# Patient Record
Sex: Male | Born: 1985 | Race: White | Hispanic: No | Smoking: Former smoker
Health system: Southern US, Community
[De-identification: ages and names within clinical notes are randomized; demographics above are authoritative.]

## PROBLEM LIST (undated history)

## (undated) DIAGNOSIS — F32A Depression, unspecified: Secondary | ICD-10-CM

## (undated) DIAGNOSIS — I10 Essential (primary) hypertension: Secondary | ICD-10-CM

## (undated) DIAGNOSIS — G629 Polyneuropathy, unspecified: Secondary | ICD-10-CM

## (undated) DIAGNOSIS — G4733 Obstructive sleep apnea (adult) (pediatric): Secondary | ICD-10-CM

## (undated) DIAGNOSIS — I2510 Atherosclerotic heart disease of native coronary artery without angina pectoris: Secondary | ICD-10-CM

## (undated) DIAGNOSIS — E7800 Pure hypercholesterolemia, unspecified: Secondary | ICD-10-CM

## (undated) DIAGNOSIS — I219 Acute myocardial infarction, unspecified: Secondary | ICD-10-CM

---

## 2016-09-02 HISTORY — PX: CARDIAC CATHETERIZATION: SHX172

## 2021-06-04 ENCOUNTER — Other Ambulatory Visit: Payer: Self-pay

## 2021-06-04 ENCOUNTER — Inpatient Hospital Stay (HOSPITAL_COMMUNITY)
Admission: EM | Admit: 2021-06-04 | Discharge: 2021-06-06 | DRG: 281 | Disposition: A | Discharge status: Home / Self Care | Payer: No Typology Code available for payment source | Attending: Internal Medicine | Admitting: Internal Medicine

## 2021-06-04 ENCOUNTER — Encounter (HOSPITAL_COMMUNITY): Payer: Self-pay | Admitting: Emergency Medicine

## 2021-06-04 ENCOUNTER — Emergency Department (HOSPITAL_COMMUNITY): Payer: No Typology Code available for payment source

## 2021-06-04 DIAGNOSIS — G629 Polyneuropathy, unspecified: Secondary | ICD-10-CM | POA: Diagnosis present

## 2021-06-04 DIAGNOSIS — E7800 Pure hypercholesterolemia, unspecified: Secondary | ICD-10-CM | POA: Diagnosis present

## 2021-06-04 DIAGNOSIS — I2510 Atherosclerotic heart disease of native coronary artery without angina pectoris: Secondary | ICD-10-CM | POA: Diagnosis present

## 2021-06-04 DIAGNOSIS — F419 Anxiety disorder, unspecified: Secondary | ICD-10-CM | POA: Diagnosis present

## 2021-06-04 DIAGNOSIS — Z20822 Contact with and (suspected) exposure to covid-19: Secondary | ICD-10-CM | POA: Diagnosis present

## 2021-06-04 DIAGNOSIS — E876 Hypokalemia: Secondary | ICD-10-CM | POA: Diagnosis not present

## 2021-06-04 DIAGNOSIS — Z7982 Long term (current) use of aspirin: Secondary | ICD-10-CM

## 2021-06-04 DIAGNOSIS — Z6841 Body Mass Index (BMI) 40.0 and over, adult: Secondary | ICD-10-CM | POA: Diagnosis not present

## 2021-06-04 DIAGNOSIS — I10 Essential (primary) hypertension: Secondary | ICD-10-CM | POA: Diagnosis present

## 2021-06-04 DIAGNOSIS — F32A Depression, unspecified: Secondary | ICD-10-CM | POA: Diagnosis present

## 2021-06-04 DIAGNOSIS — I219 Acute myocardial infarction, unspecified: Secondary | ICD-10-CM

## 2021-06-04 DIAGNOSIS — I214 Non-ST elevation (NSTEMI) myocardial infarction: Secondary | ICD-10-CM | POA: Diagnosis present

## 2021-06-04 DIAGNOSIS — G4733 Obstructive sleep apnea (adult) (pediatric): Secondary | ICD-10-CM | POA: Diagnosis present

## 2021-06-04 DIAGNOSIS — Z79899 Other long term (current) drug therapy: Secondary | ICD-10-CM

## 2021-06-04 DIAGNOSIS — Z9989 Dependence on other enabling machines and devices: Secondary | ICD-10-CM

## 2021-06-04 DIAGNOSIS — R079 Chest pain, unspecified: Secondary | ICD-10-CM | POA: Diagnosis present

## 2021-06-04 DIAGNOSIS — R739 Hyperglycemia, unspecified: Secondary | ICD-10-CM | POA: Diagnosis present

## 2021-06-04 DIAGNOSIS — I252 Old myocardial infarction: Secondary | ICD-10-CM | POA: Diagnosis not present

## 2021-06-04 DIAGNOSIS — Z7902 Long term (current) use of antithrombotics/antiplatelets: Secondary | ICD-10-CM | POA: Diagnosis not present

## 2021-06-04 DIAGNOSIS — E785 Hyperlipidemia, unspecified: Secondary | ICD-10-CM | POA: Diagnosis not present

## 2021-06-04 DIAGNOSIS — I249 Acute ischemic heart disease, unspecified: Secondary | ICD-10-CM

## 2021-06-04 HISTORY — DX: Essential (primary) hypertension: I10

## 2021-06-04 HISTORY — DX: Acute myocardial infarction, unspecified: I21.9

## 2021-06-04 HISTORY — DX: Depression, unspecified: F32.A

## 2021-06-04 HISTORY — DX: Obstructive sleep apnea (adult) (pediatric): G47.33

## 2021-06-04 HISTORY — DX: Atherosclerotic heart disease of native coronary artery without angina pectoris: I25.10

## 2021-06-04 HISTORY — DX: Polyneuropathy, unspecified: G62.9

## 2021-06-04 HISTORY — DX: Pure hypercholesterolemia, unspecified: E78.00

## 2021-06-04 LAB — BASIC METABOLIC PANEL
Anion gap: 11 (ref 5–15)
BUN: 9 mg/dL (ref 6–20)
CO2: 27 mmol/L (ref 22–32)
Calcium: 9.1 mg/dL (ref 8.9–10.3)
Chloride: 99 mmol/L (ref 98–111)
Creatinine, Ser: 1.03 mg/dL (ref 0.61–1.24)
GFR, Estimated: >60 mL/min (ref >60)
Glucose, Bld: 107 mg/dL — ABNORMAL HIGH (ref 70–99)
Potassium: 3.3 mmol/L — ABNORMAL LOW (ref 3.5–5.1)
Sodium: 137 mmol/L (ref 135–145)

## 2021-06-04 LAB — CBC
HCT: 42.9 % (ref 39.0–52.0)
Hemoglobin: 14.6 g/dL (ref 13.0–17.0)
MCH: 31.1 pg (ref 26.0–34.0)
MCHC: 34 g/dL (ref 30.0–36.0)
MCV: 91.5 fL (ref 80.0–100.0)
Platelets: 214 10*3/uL (ref 150–400)
RBC: 4.69 MIL/uL (ref 4.22–5.81)
RDW: 12.2 % (ref 11.5–15.5)
WBC: 10.4 10*3/uL (ref 4.0–10.5)
nRBC: 0 % (ref 0.0–0.2)

## 2021-06-04 LAB — HEPATIC FUNCTION PANEL
ALT: 105 U/L — ABNORMAL HIGH (ref 0–44)
AST: 67 U/L — ABNORMAL HIGH (ref 15–41)
Albumin: 3.8 g/dL (ref 3.5–5.0)
Alkaline Phosphatase: 61 U/L (ref 38–126)
Bilirubin, Direct: 0.1 mg/dL (ref 0.0–0.2)
Indirect Bilirubin: 0.6 mg/dL
Total Bilirubin: 0.7 mg/dL (ref 0.3–1.2)
Total Protein: 6.6 g/dL (ref 6.5–8.1)

## 2021-06-04 LAB — MAGNESIUM: Magnesium: 1.9 mg/dL (ref 1.7–2.4)

## 2021-06-04 LAB — RESP PANEL BY RT-PCR (FLU A&B, COVID) ARPGX2
Influenza A by PCR: NEGATIVE
Influenza B by PCR: NEGATIVE
SARS Coronavirus 2 by RT PCR: NEGATIVE

## 2021-06-04 LAB — TROPONIN I (HIGH SENSITIVITY)
Troponin I (High Sensitivity): 7445 ng/L (ref <18)
Troponin I (High Sensitivity): 6007 ng/L (ref <18)
Troponin I (High Sensitivity): 3768 ng/L (ref <18)

## 2021-06-04 LAB — TSH: TSH: 3.408 u[IU]/mL (ref 0.350–4.500)

## 2021-06-04 LAB — LIPASE, BLOOD: Lipase: 32 U/L (ref 11–51)

## 2021-06-04 LAB — HIV ANTIBODY (ROUTINE TESTING W REFLEX): HIV Screen 4th Generation wRfx: NONREACTIVE

## 2021-06-04 MED ORDER — FENTANYL CITRATE PF 50 MCG/ML IJ SOSY
50.0000 ug | PREFILLED_SYRINGE | Freq: Once | INTRAMUSCULAR | Status: AC
  Administered 2021-06-04: 50 ug via INTRAVENOUS
  Filled 2021-06-04: qty 1

## 2021-06-04 MED ORDER — SODIUM CHLORIDE 0.9 % IV SOLN: 250.0000 mL | INTRAVENOUS | Status: DC | PRN

## 2021-06-04 MED ORDER — ASPIRIN EC 81 MG PO TBEC
81.0000 mg | DELAYED_RELEASE_TABLET | Freq: Every day | ORAL | Status: DC
  Administered 2021-06-06: 81 mg via ORAL
  Filled 2021-06-04: qty 1

## 2021-06-04 MED ORDER — ZOLPIDEM TARTRATE 5 MG PO TABS: 5.0000 mg | ORAL_TABLET | Freq: Every evening | ORAL | Status: DC | PRN

## 2021-06-04 MED ORDER — BUPROPION HCL ER (XL) 150 MG PO TB24
300.0000 mg | ORAL_TABLET | Freq: Every day | ORAL | Status: DC
  Administered 2021-06-06: 300 mg via ORAL
  Filled 2021-06-04: qty 2
  Filled 2021-06-04: qty 1

## 2021-06-04 MED ORDER — ACETAMINOPHEN 325 MG PO TABS: 650.0000 mg | ORAL_TABLET | ORAL | Status: DC | PRN

## 2021-06-04 MED ORDER — CLOPIDOGREL BISULFATE 75 MG PO TABS
75.0000 mg | ORAL_TABLET | Freq: Every day | ORAL | Status: DC
  Administered 2021-06-05 – 2021-06-06 (×2): 75 mg via ORAL
  Filled 2021-06-04 (×2): qty 1

## 2021-06-04 MED ORDER — ONDANSETRON HCL 4 MG/2ML IJ SOLN: 4.0000 mg | Freq: Four times a day (QID) | INTRAMUSCULAR | Status: DC | PRN

## 2021-06-04 MED ORDER — ASPIRIN 300 MG RE SUPP: 300.0000 mg | RECTAL | Status: DC

## 2021-06-04 MED ORDER — METOPROLOL TARTRATE 25 MG PO TABS: 75.0000 mg | ORAL_TABLET | Freq: Two times a day (BID) | ORAL | Status: DC

## 2021-06-04 MED ORDER — TRAZODONE HCL 100 MG PO TABS
200.0000 mg | ORAL_TABLET | Freq: Every day | ORAL | Status: DC
  Administered 2021-06-05: 100 mg via ORAL
  Administered 2021-06-05: 200 mg via ORAL
  Filled 2021-06-04: qty 4
  Filled 2021-06-04: qty 2
  Filled 2021-06-04: qty 4

## 2021-06-04 MED ORDER — SODIUM CHLORIDE 0.9% FLUSH
3.0000 mL | Freq: Two times a day (BID) | INTRAVENOUS | Status: DC
  Administered 2021-06-06: 3 mL via INTRAVENOUS

## 2021-06-04 MED ORDER — NITROGLYCERIN IN D5W 200-5 MCG/ML-% IV SOLN
0.0000 ug/min | INTRAVENOUS | Status: DC
  Administered 2021-06-04: 2.5 ug/min via INTRAVENOUS
  Filled 2021-06-04: qty 250

## 2021-06-04 MED ORDER — HEPARIN BOLUS VIA INFUSION
4000.0000 [IU] | Freq: Once | INTRAVENOUS | Status: AC
  Administered 2021-06-04: 4000 [IU] via INTRAVENOUS
  Filled 2021-06-04: qty 4000

## 2021-06-04 MED ORDER — NITROGLYCERIN 0.4 MG SL SUBL: 0.4000 mg | SUBLINGUAL_TABLET | SUBLINGUAL | Status: DC | PRN

## 2021-06-04 MED ORDER — ALPRAZOLAM 0.25 MG PO TABS: 0.2500 mg | ORAL_TABLET | Freq: Two times a day (BID) | ORAL | Status: DC | PRN

## 2021-06-04 MED ORDER — HEPARIN (PORCINE) 25000 UT/250ML-% IV SOLN
1550.0000 [IU]/h | INTRAVENOUS | Status: DC
  Administered 2021-06-04: 19:00:00 1250 [IU]/h via INTRAVENOUS
  Administered 2021-06-05: 1550 [IU]/h via INTRAVENOUS
  Filled 2021-06-04 (×2): qty 250

## 2021-06-04 MED ORDER — GABAPENTIN 300 MG PO CAPS
600.0000 mg | ORAL_CAPSULE | Freq: Two times a day (BID) | ORAL | Status: DC
  Administered 2021-06-04 – 2021-06-06 (×3): 600 mg via ORAL
  Filled 2021-06-04 (×3): qty 2

## 2021-06-04 MED ORDER — ATORVASTATIN CALCIUM 80 MG PO TABS
80.0000 mg | ORAL_TABLET | Freq: Every day | ORAL | Status: DC
  Administered 2021-06-06: 80 mg via ORAL
  Filled 2021-06-04: qty 1

## 2021-06-04 MED ORDER — ASPIRIN 81 MG PO CHEW: 324.0000 mg | CHEWABLE_TABLET | ORAL | Status: DC

## 2021-06-04 MED ORDER — METOPROLOL TARTRATE 25 MG PO TABS
25.0000 mg | ORAL_TABLET | Freq: Two times a day (BID) | ORAL | Status: DC
  Administered 2021-06-04 – 2021-06-06 (×3): 25 mg via ORAL
  Filled 2021-06-04 (×2): qty 1

## 2021-06-04 MED ORDER — DULOXETINE HCL 60 MG PO CPEP
60.0000 mg | ORAL_CAPSULE | Freq: Two times a day (BID) | ORAL | Status: DC
Start: 2021-06-04 — End: 2021-06-06
  Administered 2021-06-05 – 2021-06-06 (×3): 60 mg via ORAL
  Filled 2021-06-04 (×5): qty 1

## 2021-06-04 MED ORDER — SODIUM CHLORIDE 0.9% FLUSH: 3.0000 mL | INTRAVENOUS | Status: DC | PRN

## 2021-06-04 NOTE — H&P (Signed)
ADMISSION HISTORY & PHYSICAL  Patient Name: Joshua Flynn Date of Encounter: 06/04/2021 Primary Care Physician: Pcp, No Cardiologist: None  Chief Complaint   NSTEMI  Patient Profile   36 yo male with known CAD (non-obstructive) by cath in 2018, presents with severe chest pain and elevated troponin, consistent with NSTEMI  HPI   This is a 36 y.o. male Thor with a past medical history significant for CAD, HTN, dyslipidemia, tobacco and etoh use, presents with chest pain that started last evening.  The pain was described as dull but sometimes sharp and radiates to the left chest.  The pain is similar to what symptoms he had had in 2018, at which time he was found to have NSTEMI.  He underwent cardiac catheterization at [facility] in [place], [place] after being transferred from the [facility] ER.  According to the records at that time he had persistent chest pain and was admitted initially for pericarditis however echo and CRP were normal.  Troponin he peaked at 0.25.  CT was negative for dissection or PE.  He underwent left heart catheterization.  This demonstrated moderate 40 to 50% mid LAD stenosis and distal tapering of the LAD.  There was moderately to severe ostial diagonal branch stenosis, moderate 40 to 50% distal left circumflex stenosis of a codominant vessel and diffuse tubular 60% mid RCA stenosis.  iFR was performed of the mid RCA which was negative.  Aggressive medical management was recommended.  He reports compliance with his medications and was discharged at that time on aspirin, 80 mg atorvastatin, Plavix, Imdur, metoprolol and hydrochlorothiazide.   PMHx   Past Medical History:  Diagnosis Date   Depression    Hypercholesterolemia    Hypertension    Myocardial infarct [facility])     Past Surgical History:  Procedure Laterality Date   CARDIAC CATHETERIZATION  09/2016   [facility], [place]    FAMHx   Family History  Adopted: Yes    SOCHx    reports that he quit  smoking about 6 years ago. His smoking use included cigarettes. He has never used smokeless tobacco. He reports current alcohol use. He reports that he does not currently use drugs.  Outpatient Medications   1) ASPIRIN 81MG  EC TAB TAKE ONE TABLET BY MOUTH DAILY ACTIVE FOR HEART 2) ATORVASTATIN CALCIUM 80MG  TAB TAKE ONE TABLET BY ACTIVE MOUTH AT BEDTIME FOR CHOLESTEROL 3) BUPROPION HCL 300MG  24HR SA TAB TAKE ONE TABLET BY ACTIVE MOUTH EVERY MORNING FOR DEPRESSION 4) CLOPIDOGREL BISULFATE 75MG  TAB TAKE ONE TABLET BY ACTIVE MOUTH DAILY 5) DULOXETINE HCL 60MG  EC CAP TAKE ONE CAPSULE BY MOUTH ACTIVE TWICE A DAY 6) GABAPENTIN 300MG  CAP TAKE TWO CAPSULES BY MOUTH TWICE ACTIVE A DAY FOR ANXIETY, SLEEP, AND ALCOHOL CRAVINGS 7) HYDROCHLOROTHIAZIDE 50MG  TAB TAKE ONE TABLET BY MOUTH ACTIVE DAILY 8) ISOSORBIDE MONONITRATE 120MG  SA TAB TAKE ONE TABLET ACTIVE BY MOUTH DAILY 9) METOPROLOL TARTRATE 50MG  TAB TAKE ONE AND ONE-HALF ACTIVE TABLETS BY MOUTH TWICE A DAY FOR HEART 10) TRAZODONE HCL 100MG  TAB TAKE TWO TABLETS BY MOUTH AT ACTIVE BEDTIME FOR DEPRESSION/SLEEP   Inpatient Medications    Scheduled Meds:  aspirin EC  81 mg Oral Daily   atorvastatin  80 mg Oral Daily   [START ON 06/05/2021] buPROPion  300 mg Oral Daily   clopidogrel  75 mg Oral Daily   DULoxetine  60 mg Oral BID   gabapentin  600 mg Oral BID   metoprolol tartrate  25 mg Oral  BID   traZODone  200 mg Oral QHS     Continuous Infusions:  heparin 1,250 Units/hr (06/04/21 1903)   nitroGLYCERIN      PRN Meds:    ALLERGIES   No Known Allergies  ROS   Pertinent items noted in HPI and remainder of comprehensive ROS otherwise negative.  Vitals   Vitals:   06/04/21 1955 06/04/21 2015 06/04/21 2030 06/04/21 2045  BP: 105/64 99/64 101/64 104/68  Pulse: 72 72 70 70  Resp: 18 12 20  (!) 21  Temp:      TempSrc:      SpO2: 97% 97% 96% 95%  Weight:      Height:       No intake or output data in the 24 hours ending  06/04/21 2121 Filed Weights   06/04/21 1735  Weight: (!) 137 kg    Physical Exam   General appearance: alert and no distress Neck: no carotid bruit, no JVD, thyroid not enlarged, symmetric, no tenderness/mass/nodules, and face appears flushed Lungs: clear to auscultation bilaterally Heart: regular rate and rhythm, S1, S2 normal, no murmur, click, rub or gallop Abdomen: soft, non-tender; bowel sounds normal; no masses,  no organomegaly and obese Extremities: extremities normal, atraumatic, no cyanosis or edema Pulses: 2+ and symmetric Skin: Skin color, texture, turgor normal. No rashes or lesions Neurologic: Grossly normal Psych: pleasant  Labs   Results for orders placed or performed during the hospital encounter of 06/04/21 (from the past 48 hour(s))  Basic metabolic panel     Status: Abnormal   Collection Time: 06/04/21  5:30 PM  Result Value Ref Range   Sodium 137 135 - 145 mmol/L   Potassium 3.3 (L) 3.5 - 5.1 mmol/L   Chloride 99 98 - 111 mmol/L   CO2 27 22 - 32 mmol/L   Glucose, Bld 107 (H) 70 - 99 mg/dL    Comment: Glucose reference range applies only to samples taken after fasting for at least 8 hours.   BUN 9 6 - 20 mg/dL   Creatinine, Ser 3.47 0.61 - 1.24 mg/dL   Calcium 9.1 8.9 - 42.5 mg/dL   GFR, Estimated >95 >63 mL/min    Comment: (NOTE) Calculated using the CKD-EPI Creatinine Equation (2021)    Anion gap 11 5 - 15    Comment: Performed at [facility], [address] [place], [place] [address]  CBC     Status: None   Collection Time: 06/04/21  5:30 PM  Result Value Ref Range   WBC 10.4 4.0 - 10.5 K/uL   RBC 4.69 4.22 - 5.81 MIL/uL   Hemoglobin 14.6 13.0 - 17.0 g/dL   HCT 33.2 95.1 - 88.4 %   MCV 91.5 80.0 - 100.0 fL   MCH 31.1 26.0 - 34.0 pg   MCHC 34.0 30.0 - 36.0 g/dL   RDW 16.6 06.3 - 01.6 %   Platelets 214 150 - 400 K/uL   nRBC 0.0 0.0 - 0.2 %    Comment: Performed at [facility], [address], [place], [place] [address]   Troponin I (High Sensitivity)     Status: Abnormal   Collection Time: 06/04/21  5:30 PM  Result Value Ref Range   Troponin I (High Sensitivity) 7,445 (HH) <18 ng/L    Comment: CRITICAL RESULT CALLED TO, READ BACK BY AND VERIFIED WITH: Carma Leaven, RN ON1/31/23 @ 1842 BY APPIAH D (NOTE) Elevated high sensitivity troponin I (hsTnI) values and significant  changes across serial  measurements may suggest ACS but many other  chronic and acute conditions are known to elevate hsTnI results.  Refer to the Links section for chest pain algorithms and additional  guidance. Performed at [facility], [place] [address], [place], [place] [address]   Resp Panel by RT-PCR (Flu A&B, Covid) Nasopharyngeal Swab     Status: None   Collection Time: 06/04/21  5:52 PM   Specimen: Nasopharyngeal Swab; Nasopharyngeal(NP) swabs in vial transport medium  Result Value Ref Range   SARS Coronavirus 2 by RT PCR NEGATIVE NEGATIVE    Comment: (NOTE) SARS-CoV-2 target nucleic acids are NOT DETECTED.  The SARS-CoV-2 RNA is generally detectable in upper respiratory specimens during the acute phase of infection. The lowest concentration of SARS-CoV-2 viral copies this assay can detect is 138 copies/mL. A negative result does not preclude SARS-Cov-2 infection and should not be used as the sole basis for treatment or other patient management decisions. A negative result may occur with  improper specimen collection/handling, submission of specimen other than nasopharyngeal swab, presence of viral mutation(s) within the areas targeted by this assay, and inadequate number of viral copies(<138 copies/mL). A negative result must be combined with clinical observations, patient history, and epidemiological information. The expected result is Negative.  Fact Sheet for Patients:  EntrepreneurPulse.com.au  Fact Sheet for Healthcare Providers:  IncredibleEmployment.be  This test is no t  yet approved or cleared by the [place] FDA and  has been authorized for detection and/or diagnosis of SARS-CoV-2 by FDA under an Emergency Use Authorization (EUA). This EUA will remain  in effect (meaning this test can be used) for the duration of the COVID-19 declaration under Section 564(b)(1) of the Act, 21 U.S.C.section 360bbb-3(b)(1), unless the authorization is terminated  or revoked sooner.       Influenza A by PCR NEGATIVE NEGATIVE   Influenza B by PCR NEGATIVE NEGATIVE    Comment: (NOTE) The Xpert Xpress SARS-CoV-2/FLU/RSV plus assay is intended as an aid in the diagnosis of influenza from Nasopharyngeal swab specimens and should not be used as a sole basis for treatment. Nasal washings and aspirates are unacceptable for Xpert Xpress SARS-CoV-2/FLU/RSV testing.  Fact Sheet for Patients: EntrepreneurPulse.com.au  Fact Sheet for Healthcare Providers: IncredibleEmployment.be  This test is not yet approved or cleared by the [place] FDA and has been authorized for detection and/or diagnosis of SARS-CoV-2 by FDA under an Emergency Use Authorization (EUA). This EUA will remain in effect (meaning this test can be used) for the duration of the COVID-19 declaration under Section 564(b)(1) of the Act, 21 U.S.C. section 360bbb-3(b)(1), unless the authorization is terminated or revoked.  Performed at [facility], [place] [address] [place], [place] [address]   Hepatic function panel     Status: Abnormal   Collection Time: 06/04/21  8:00 PM  Result Value Ref Range   Total Protein 6.6 6.5 - 8.1 g/dL   Albumin 3.8 3.5 - 5.0 g/dL   AST 67 (H) 15 - 41 U/L   ALT 105 (H) 0 - 44 U/L   Alkaline Phosphatase 61 38 - 126 U/L   Total Bilirubin 0.7 0.3 - 1.2 mg/dL   Bilirubin, Direct 0.1 0.0 - 0.2 mg/dL   Indirect Bilirubin 0.6 mg/dL    Comment: Performed at [place] [address], [place], [place] [address]  Lipase, blood      Status: None   Collection Time: 06/04/21  8:00 PM  Result Value Ref Range  Lipase 32 11 - 51 U/L    Comment: Performed at [facility], [place] [address] [place], [place] [address]    ECG   Sinus rhythm at 85, small inferior Q waves- Personally Reviewed  Telemetry   Sinus rhythm - Personally Reviewed  Radiology   DG Chest Portable 1 View  Result Date: 06/04/2021 CLINICAL DATA:  Chest pain EXAM: PORTABLE CHEST 1 VIEW COMPARISON:  06/04/2021 FINDINGS: The heart size and mediastinal contours are within normal limits. Both lungs are clear. The visualized skeletal structures are unremarkable. IMPRESSION: No active disease. Electronically Signed   By: Franchot Gallo M.D.   On: 06/04/2021 18:18    Cardiac Studies   N/A  Assessment   Principal Problem:   NSTEMI (non-ST elevated myocardial infarction) ([place]) Active Problems:   HTN (hypertension)   Dyslipidemia, goal LDL below 55   Neuropathy   Depression   CAD in native artery   OSA on CPAP   Morbid obesity ([place])   Plan   NSTEMI Presented with significantly elevated troponin greater than 7000.  Subsequent troponin is pending.  Suspect he had possible out-of-hospital STEMI although has no evidence of ST elevation but small inferior Q waves.  He had known multivessel mild to moderate coronary disease which is nonobstructive during a prior non-STEMI in 2018.  On IV heparin.  Chest pain persists and will start nitroglycerin and provide opiates for additional pain control.  Plan for elective cardiac catheterization tomorrow unless he has uncontrollable pain overnight and may need further intervention.  The overnight fellow was made aware of his situation.  We will continue aspirin, Plavix, statin, and beta-blocker as well. HTN Blood pressure has been normal to low normal.  We will need to monitor on nitroglycerin infusion.  Continue current medicines except for hydrochlorothiazide which will be held due to probable dye load  expected a cardiac catheterization and to allow more room for antianginal medicines. HLD He is currently on atorvastatin 80 mg nightly which we will continue.  Check fasting lipid profile in the morning.  Target LDL less than 55 given very high risk and acute NSTEMI with multiple risk factors. Depression Continue Cymbalta and bupropion. Neuropathy Continue gabapentin 600 mg twice daily. OSA on CPAP CPAP nightly as per RT. 7.   Transaminitis AST and ALT are elevated at 67/105.  This may be due to high potency atorvastatin with underlying fatty liver disease.  We will need to monitor this.  FULL CODE  Time Spent Directly with Patient:  I have spent a total of 45 minutes with patient reviewing hospital notes, telemetry, EKGs, labs and examining the patient as well as establishing an assessment and plan that was discussed with the patient.  > 50% of time was spent in direct patient care.   Length of Stay:  LOS: 0 days   Pixie Casino, MD, [facility], [place] Director of the Advanced Lipid Disorders &  Cardiovascular Risk Reduction Clinic Diplomate of the American Board of Clinical Lipidology Attending Cardiologist  Direct Dial: [phone]   Fax: [phone]  Website:  [email or web address] 06/04/2021, 9:21 PM

## 2021-06-04 NOTE — ED Notes (Signed)
Lab called w/ critical trop Molli Hazard MD aware

## 2021-06-04 NOTE — ED Notes (Signed)
Cardiology paged regarding nitro drip and pt's lower BP. Instructor to continue to start nitro drip for pain control

## 2021-06-04 NOTE — ED Provider Notes (Signed)
[facility] EMERGENCY DEPARTMENT Provider Note   CSN: [id number] Arrival date & time: 06/04/21  1723     History  Chief Complaint  Patient presents with   Chest Pain    Joshua Flynn is a 36 y.o. male.  The history is provided by the patient and medical records.  Chest Pain Joshua Flynn is a 36 y.o. male who presents to the Emergency Department complaining of chest pain.  He presents to the ED for evaluation of chest pain that started last night.  Pain is described as dull and sharp that radiates to the left chest.  Sxs are similar to last MI 4 years ago.  No fever, cough, N, sob.  He is compliant with his home medications.  He did take 3 Imdur, 120 mg for his pain today due to not having nitroglycerin available.  He did take 1 nitroglycerin by EMS, which only worsened his pain.  His metoprolol was recently increased to 100 mg twice daily.  He is on Plavix 75 mg daily.  He is a patient of the [place].  He had a heart cath in 2018 at [facility].  No stents were placed at that time but he was placed on aggressive medical therapy.      Home Medications Prior to Admission medications   Not on File      Allergies    Patient has no known allergies.    Review of Systems   Review of Systems  Cardiovascular:  Positive for chest pain.  All other systems reviewed and are negative.  Physical Exam Updated Vital Signs BP 104/68    Pulse 70    Temp 98.1 F (36.7 C) (Oral)    Resp (!) 21    Ht 5\' 10"  (1.778 m)    Wt (!) 137 kg    SpO2 95%    BMI 43.33 kg/m  Physical Exam Vitals and nursing note reviewed.  Constitutional:      General: He is in acute distress.     Appearance: He is well-developed. He is ill-appearing and diaphoretic.  HENT:     Head: Normocephalic and atraumatic.  Cardiovascular:     Rate and Rhythm: Normal rate and regular rhythm.     Heart sounds: No murmur heard. Pulmonary:     Effort: Pulmonary effort is normal. No respiratory distress.      Breath sounds: Normal breath sounds.  Abdominal:     Palpations: Abdomen is soft.     Tenderness: There is no abdominal tenderness. There is no guarding or rebound.  Musculoskeletal:        General: No swelling or tenderness.     Comments: 2+ DP pulses bilaterally  Skin:    General: Skin is warm.  Neurological:     Mental Status: He is alert and oriented to person, place, and time.  Psychiatric:        Behavior: Behavior normal.    ED Results / Procedures / Treatments   Labs (all labs ordered are listed, but only abnormal results are displayed) Labs Reviewed  BASIC METABOLIC PANEL - Abnormal; Notable for the following components:      Result Value   Potassium 3.3 (*)    Glucose, Bld 107 (*)    All other components within normal limits  HEPATIC FUNCTION PANEL - Abnormal; Notable for the following components:   AST 67 (*)    ALT 105 (*)    All other components within normal limits  TROPONIN I (HIGH SENSITIVITY) - Abnormal; Notable for the following components:   Troponin I (High Sensitivity) 7,445 (*)    All other components within normal limits  RESP PANEL BY RT-PCR (FLU A&B, COVID) [id number]  CBC  LIPASE, BLOOD  HEPARIN LEVEL (UNFRACTIONATED)  CBC  HEPARIN LEVEL (UNFRACTIONATED)  TROPONIN I (HIGH SENSITIVITY)    EKG EKG Interpretation  Date/Time:  Tuesday June 04 2021 17:51:53 EST Ventricular Rate:  85 PR Interval:  148 QRS Duration: 93 QT Interval:  369 QTC Calculation: 439 R Axis:   44 Text Interpretation: Sinus rhythm Confirmed by Tilden Fossa [phone]) on 06/04/2021 6:03:40 PM  Radiology DG Chest Portable 1 View  Result Date: 06/04/2021 CLINICAL DATA:  Chest pain EXAM: PORTABLE CHEST 1 VIEW COMPARISON:  06/04/2021 FINDINGS: The heart size and mediastinal contours are within normal limits. Both lungs are clear. The visualized skeletal structures are unremarkable. IMPRESSION: No active disease. Electronically Signed   By: Marlan Palau M.D.   On:  06/04/2021 18:18    Procedures Procedures   CRITICAL CARE Performed by: Tilden Fossa   Total critical care time: 40 minutes  Critical care time was exclusive of separately billable procedures and treating other patients.  Critical care was necessary to treat or prevent imminent or life-threatening deterioration.  Critical care was time spent personally by me on the following activities: development of treatment plan with patient and/or surrogate as well as nursing, discussions with consultants, evaluation of patient's response to treatment, examination of patient, obtaining history from patient or surrogate, ordering and performing treatments and interventions, ordering and review of laboratory studies, ordering and review of radiographic studies, pulse oximetry and re-evaluation of patient's condition.  Medications Ordered in ED Medications  heparin ADULT infusion 100 units/mL (25000 units/259mL) (1,250 Units/hr Intravenous New Bag/Given 06/04/21 1903)  aspirin EC tablet 81 mg (81 mg Oral Not Given 06/04/21 2003)  atorvastatin (LIPITOR) tablet 80 mg (has no administration in time range)  buPROPion (WELLBUTRIN XL) 24 hr tablet 300 mg (has no administration in time range)  clopidogrel (PLAVIX) tablet 75 mg (has no administration in time range)  DULoxetine (CYMBALTA) DR capsule 60 mg (has no administration in time range)  gabapentin (NEURONTIN) capsule 600 mg (has no administration in time range)  traZODone (DESYREL) tablet 200 mg (has no administration in time range)  nitroGLYCERIN 50 mg in dextrose 5 % 250 mL (0.2 mg/mL) infusion (has no administration in time range)  metoprolol tartrate (LOPRESSOR) tablet 25 mg (has no administration in time range)  fentaNYL (SUBLIMAZE) injection 50 mcg (50 mcg Intravenous Given 06/04/21 1757)  fentaNYL (SUBLIMAZE) injection 50 mcg (50 mcg Intravenous Given 06/04/21 1901)  heparin bolus via infusion 4,000 Units (4,000 Units Intravenous Bolus from Bag  06/04/21 1903)    ED Course/ Medical Decision Making/ A&P                           Medical Decision Making Amount and/or Complexity of Data Reviewed Labs: ordered. Radiology: ordered.  Risk Prescription drug management. Decision regarding hospitalization.  Patient with history of coronary artery disease, hypertension, hyperlipidemia here for evaluation of chest pain that feels similar to his prior MI.  He is ill-appearing on ED presentation with diaphoresis, distress.  He has symmetric pulses.  EKG does show Q waves in the inferior leads but no ST elevation.  No priors are available for for comparison.  He received 324 mg of aspirin prior to ED arrival.  Chest x-ray  without acute abnormality, personally reviewed.  Current clinical picture is not consistent with PE, dissection.  Concern for acute coronary syndrome given his symptoms, EKG.  Due to patient having hypotension prehospital with nitroglycerin as well as no relief of pain he was treated with fentanyl.  His initial troponin did return elevated at greater than 7000.  Records reviewed in epic, he did have a prior cardiac cath in 2018 that showed extensive disease at that time.  Discussed with patient findings of studies and recommendation for admission and he is in agreement with treatment plan.  Cardiology consulted for admission for ongoing treatment.  He was started on heparin drip for acute coronary syndrome.         Final Clinical Impression(s) / ED Diagnoses Final diagnoses:  ACS (acute coronary syndrome) [facility])    Rx / DC Orders ED Discharge Orders     None         Tilden Fossa, MD 06/04/21 2128

## 2021-06-04 NOTE — Progress Notes (Signed)
ANTICOAGULATION CONSULT NOTE - Follow Up Consult  Pharmacy Consult for Heparin drip Indication: chest pain/ACS  No Known Allergies  Patient Measurements: Height: 5\' 10"  (177.8 cm) Weight: (!) 137 kg (302 lb) IBW/kg (Calculated) : 73 Heparin Dosing Weight: 105 kg  Vital Signs: Temp: 98.1 F (36.7 C) (01/31 1730) Temp Source: Oral (01/31 1730) BP: 105/51 (01/31 1845) Pulse Rate: 84 (01/31 1845)  Labs: Recent Labs    06/04/21 1730  HGB 14.6  HCT 42.9  PLT 214  CREATININE 1.03  TROPONINIHS 7,445*    Estimated Creatinine Clearance: 136.9 mL/min (by C-G formula based on SCr of 1.03 mg/dL).   Medications:  Infusions:   Assessment: 36 y.o. male who presents to the Emergency Department complaining of chest pain. Heparin consult for IV heparin. No documented anticoagulation PTA  H/H 14.6/429, plt 214  Goal of Therapy:  Heparin level 0.3-0.7 units/ml Monitor platelets by anticoagulation protocol: Yes   Plan:  Heparin 4000 units bolus x1 followed by heparin drip at 1250 units/hr Heparin level at 6 hours Daily heparin level and CBC ordered Monitor for signs/symptoms of bleed  Thank you for allowing pharmacy to be a part of this patients care.  02-22-1983, PharmD Clinical Pharmacist  Please check AMION for all [facility] Pharmacy numbers After 10:00 PM, call Main Pharmacy [phone]

## 2021-06-04 NOTE — ED Triage Notes (Signed)
Pt here from home via [facility] for chest pain, started last night approx 1800 after dinner, pt says it has been ongoing since. Pt describes it L non-radiating cp, fluctuates between dull & sharp pain. 324mg  ASA, ems only gave 1 SL nitro due to pain increasing after nitro and BP went from 124/70 to 98/60. 20g L AC, NS given. Hx MI no stents

## 2021-06-05 ENCOUNTER — Encounter (HOSPITAL_COMMUNITY): Payer: Self-pay | Admitting: Internal Medicine

## 2021-06-05 ENCOUNTER — Inpatient Hospital Stay (HOSPITAL_COMMUNITY): Payer: No Typology Code available for payment source

## 2021-06-05 ENCOUNTER — Encounter (HOSPITAL_COMMUNITY)
Admission: EM | Disposition: A | Discharge status: Home / Self Care | Payer: Self-pay | Source: Home / Self Care | Attending: Internal Medicine

## 2021-06-05 DIAGNOSIS — I214 Non-ST elevation (NSTEMI) myocardial infarction: Secondary | ICD-10-CM

## 2021-06-05 HISTORY — PX: LEFT HEART CATH AND CORONARY ANGIOGRAPHY: CATH118249

## 2021-06-05 LAB — CBC
HCT: 39 % (ref 39.0–52.0)
Hemoglobin: 13.3 g/dL (ref 13.0–17.0)
MCH: 31.1 pg (ref 26.0–34.0)
MCHC: 34.1 g/dL (ref 30.0–36.0)
MCV: 91.3 fL (ref 80.0–100.0)
Platelets: 162 10*3/uL (ref 150–400)
RBC: 4.27 MIL/uL (ref 4.22–5.81)
RDW: 12.3 % (ref 11.5–15.5)
WBC: 6.9 10*3/uL (ref 4.0–10.5)
nRBC: 0 % (ref 0.0–0.2)

## 2021-06-05 LAB — ECHOCARDIOGRAM COMPLETE
AR max vel: 3.77 cm2
AV Area VTI: 3.6 cm2
AV Area mean vel: 3.56 cm2
AV Mean grad: 3 mmHg
AV Peak grad: 5.1 mmHg
Ao pk vel: 1.13 m/s
Area-P 1/2: 3.34 cm2
Height: 70 in
S' Lateral: 2.7 cm
Weight: 4832 oz

## 2021-06-05 LAB — BASIC METABOLIC PANEL
Anion gap: 10 (ref 5–15)
BUN: 9 mg/dL (ref 6–20)
CO2: 27 mmol/L (ref 22–32)
Calcium: 8.8 mg/dL — ABNORMAL LOW (ref 8.9–10.3)
Chloride: 100 mmol/L (ref 98–111)
Creatinine, Ser: 0.97 mg/dL (ref 0.61–1.24)
GFR, Estimated: >60 mL/min (ref >60)
Glucose, Bld: 163 mg/dL — ABNORMAL HIGH (ref 70–99)
Potassium: 3.3 mmol/L — ABNORMAL LOW (ref 3.5–5.1)
Sodium: 137 mmol/L (ref 135–145)

## 2021-06-05 LAB — HEPARIN LEVEL (UNFRACTIONATED)
Heparin Unfractionated: 0.19 IU/mL — ABNORMAL LOW (ref 0.30–0.70)
Heparin Unfractionated: 0.34 IU/mL (ref 0.30–0.70)

## 2021-06-05 LAB — TROPONIN I (HIGH SENSITIVITY): Troponin I (High Sensitivity): 3045 ng/L (ref <18)

## 2021-06-05 SURGERY — LEFT HEART CATH AND CORONARY ANGIOGRAPHY
Anesthesia: LOCAL

## 2021-06-05 MED ORDER — SODIUM CHLORIDE 0.9% FLUSH: 3.0000 mL | INTRAVENOUS | Status: DC | PRN

## 2021-06-05 MED ORDER — LIDOCAINE HCL (PF) 1 % IJ SOLN
INTRAMUSCULAR | Status: DC | PRN
  Administered 2021-06-05: 2 mL

## 2021-06-05 MED ORDER — ASPIRIN 81 MG PO CHEW: 81.0000 mg | CHEWABLE_TABLET | ORAL | Status: DC

## 2021-06-05 MED ORDER — PERFLUTREN LIPID MICROSPHERE
1.0000 mL | INTRAVENOUS | Status: AC | PRN
  Administered 2021-06-05: 6 mL via INTRAVENOUS
  Filled 2021-06-05: qty 10

## 2021-06-05 MED ORDER — FENTANYL CITRATE (PF) 100 MCG/2ML IJ SOLN
INTRAMUSCULAR | Status: DC | PRN
  Administered 2021-06-05 (×2): 25 ug via INTRAVENOUS

## 2021-06-05 MED ORDER — FENTANYL CITRATE (PF) 100 MCG/2ML IJ SOLN
INTRAMUSCULAR | Status: AC
  Filled 2021-06-05: qty 2

## 2021-06-05 MED ORDER — VERAPAMIL HCL 2.5 MG/ML IV SOLN
INTRAVENOUS | Status: AC
  Filled 2021-06-05: qty 2

## 2021-06-05 MED ORDER — MIDAZOLAM HCL 2 MG/2ML IJ SOLN
INTRAMUSCULAR | Status: DC | PRN
  Administered 2021-06-05 (×2): 1 mg via INTRAVENOUS

## 2021-06-05 MED ORDER — POTASSIUM CHLORIDE CRYS ER 20 MEQ PO TBCR
40.0000 meq | EXTENDED_RELEASE_TABLET | ORAL | Status: AC
Start: 2021-06-05 — End: 2021-06-05
  Administered 2021-06-05: 40 meq via ORAL
  Filled 2021-06-05: qty 2

## 2021-06-05 MED ORDER — AMLODIPINE BESYLATE 2.5 MG PO TABS
2.5000 mg | ORAL_TABLET | Freq: Every day | ORAL | Status: DC
  Administered 2021-06-06: 2.5 mg via ORAL
  Filled 2021-06-05 (×2): qty 1

## 2021-06-05 MED ORDER — HEPARIN SODIUM (PORCINE) 1000 UNIT/ML IJ SOLN
INTRAMUSCULAR | Status: DC | PRN
  Administered 2021-06-05: 7000 [IU] via INTRAVENOUS

## 2021-06-05 MED ORDER — SODIUM CHLORIDE 0.9 % IV SOLN: INTRAVENOUS | Status: AC

## 2021-06-05 MED ORDER — VERAPAMIL HCL 2.5 MG/ML IV SOLN
INTRAVENOUS | Status: DC | PRN
  Administered 2021-06-05: 10 mL via INTRA_ARTERIAL

## 2021-06-05 MED ORDER — HYDRALAZINE HCL 20 MG/ML IJ SOLN: 10.0000 mg | INTRAMUSCULAR | Status: AC | PRN

## 2021-06-05 MED ORDER — MIDAZOLAM HCL 2 MG/2ML IJ SOLN
INTRAMUSCULAR | Status: AC
  Filled 2021-06-05: qty 2

## 2021-06-05 MED ORDER — SODIUM CHLORIDE 0.9 % WEIGHT BASED INFUSION: 1.0000 mL/kg/h | INTRAVENOUS | Status: DC

## 2021-06-05 MED ORDER — SODIUM CHLORIDE 0.9 % IV SOLN: 250.0000 mL | INTRAVENOUS | Status: DC | PRN

## 2021-06-05 MED ORDER — HEPARIN (PORCINE) IN NACL 1000-0.9 UT/500ML-% IV SOLN
INTRAVENOUS | Status: DC | PRN
  Administered 2021-06-05 (×2): 500 mL

## 2021-06-05 MED ORDER — ASPIRIN 81 MG PO CHEW
81.0000 mg | CHEWABLE_TABLET | ORAL | Status: AC
  Administered 2021-06-05: 81 mg via ORAL
  Filled 2021-06-05: qty 1

## 2021-06-05 MED ORDER — HEPARIN (PORCINE) IN NACL 1000-0.9 UT/500ML-% IV SOLN
INTRAVENOUS | Status: AC
  Filled 2021-06-05: qty 1000

## 2021-06-05 MED ORDER — LABETALOL HCL 5 MG/ML IV SOLN: 10.0000 mg | INTRAVENOUS | Status: AC | PRN

## 2021-06-05 MED ORDER — LIDOCAINE HCL (PF) 1 % IJ SOLN
INTRAMUSCULAR | Status: AC
  Filled 2021-06-05: qty 30

## 2021-06-05 MED ORDER — HEPARIN SODIUM (PORCINE) 1000 UNIT/ML IJ SOLN
INTRAMUSCULAR | Status: AC
  Filled 2021-06-05: qty 10

## 2021-06-05 MED ORDER — HEPARIN BOLUS VIA INFUSION
2000.0000 [IU] | Freq: Once | INTRAVENOUS | Status: AC
  Administered 2021-06-05: 2000 [IU] via INTRAVENOUS
  Filled 2021-06-05: qty 2000

## 2021-06-05 MED ORDER — SODIUM CHLORIDE 0.9% FLUSH
3.0000 mL | Freq: Two times a day (BID) | INTRAVENOUS | Status: DC
  Administered 2021-06-05 – 2021-06-06 (×2): 3 mL via INTRAVENOUS

## 2021-06-05 MED ORDER — SODIUM CHLORIDE 0.9 % WEIGHT BASED INFUSION: 3.0000 mL/kg/h | INTRAVENOUS | Status: DC

## 2021-06-05 MED ORDER — SODIUM CHLORIDE 0.9 % WEIGHT BASED INFUSION
1.0000 mL/kg/h | INTRAVENOUS | Status: DC
  Administered 2021-06-05: 1 mL/kg/h via INTRAVENOUS

## 2021-06-05 SURGICAL SUPPLY — 11 items
CATH INFINITI 5FR ANG PIGTAIL (CATHETERS) ×1 IMPLANT
CATH OPTITORQUE TIG 4.0 5F (CATHETERS) ×1 IMPLANT
DEVICE RAD COMP TR BAND LRG (VASCULAR PRODUCTS) ×1 IMPLANT
GLIDESHEATH SLEND SS 6F .021 (SHEATH) ×1 IMPLANT
GUIDEWIRE INQWIRE 1.5J.035X260 (WIRE) IMPLANT
INQWIRE 1.5J .035X260CM (WIRE) ×2
KIT HEART LEFT (KITS) ×2 IMPLANT
PACK CARDIAC CATHETERIZATION (CUSTOM PROCEDURE TRAY) ×2 IMPLANT
SHEATH PROBE COVER 6X72 (BAG) ×1 IMPLANT
TRANSDUCER W/STOPCOCK (MISCELLANEOUS) ×2 IMPLANT
TUBING CIL FLEX 10 FLL-RA (TUBING) ×2 IMPLANT

## 2021-06-05 NOTE — Progress Notes (Signed)
ANTICOAGULATION CONSULT NOTE  Pharmacy Consult for Heparin  Indication: chest pain/ACS Brief A/P: Heparin level subtherapeutic Increase Heparin rate  No Known Allergies  Patient Measurements: Height: 5\' 10"  (177.8 cm) Weight: (!) 137 kg (302 lb) IBW/kg (Calculated) : 73 Heparin Dosing Weight: 105 kg  Vital Signs: Temp: 98.1 F (36.7 C) (01/31 1730) Temp Source: Oral (01/31 1730) BP: 122/70 (02/01 0200) Pulse Rate: 78 (02/01 0200)  Labs: Recent Labs    06/04/21 1730 06/04/21 2000 06/04/21 2232 06/05/21 0000 06/05/21 0208  HGB 14.6  --   --   --   --   HCT 42.9  --   --   --   --   PLT 214  --   --   --   --   HEPARINUNFRC  --   --   --   --  0.19*  CREATININE 1.03  --   --   --   --   TROPONINIHS 7,445* 6,007* 3,768* 3,045*  --      Estimated Creatinine Clearance: 136.9 mL/min (by C-G formula based on SCr of 1.03 mg/dL).  Assessment: 36 y.o. male with chest pain for heparin   Goal of Therapy:  Heparin level 0.3-0.7 units/ml Monitor platelets by anticoagulation protocol: Yes   Plan:  Heparin 2000 units IV bolus, then increase heparin  1550 units/hr Follow up after cath today   30, PharmD, BCPS

## 2021-06-05 NOTE — Care Management (Signed)
1538 06-05-21 Case Manager called the [facility] Fees Coordinator to make them aware that the patient is hospitalized. Patient states he is a member of the [employer] [place]. Case Manager will await call from the Fees Coordinator.

## 2021-06-05 NOTE — Progress Notes (Signed)
ANTICOAGULATION CONSULT NOTE - Follow Up Consult  Pharmacy Consult for Heparin drip Indication: chest pain/ACS  No Known Allergies  Patient Measurements: Height: 5\' 10"  (177.8 cm) Weight: (!) 137 kg (302 lb) IBW/kg (Calculated) : 73 Heparin Dosing Weight: 105 kg  Vital Signs: BP: 106/70 (02/01 0945) Pulse Rate: 68 (02/01 0945)  Labs: Recent Labs    06/04/21 1730 06/04/21 2000 06/04/21 2232 06/05/21 0000 06/05/21 0208 06/05/21 0609 06/05/21 0610  HGB 14.6  --   --   --   --   --  13.3  HCT 42.9  --   --   --   --   --  39.0  PLT 214  --   --   --   --   --  162  HEPARINUNFRC  --   --   --   --  0.19* 0.34  --   CREATININE 1.03  --   --   --   --   --  0.97  TROPONINIHS 7,445* 6,007* 3,768* 3,045*  --   --   --      Estimated Creatinine Clearance: 145.4 mL/min (by C-G formula based on SCr of 0.97 mg/dL).   Assessment: 36 y.o. male who presents to the Emergency Department complaining of chest pain. Heparin consult for IV heparin. No documented anticoagulation PTA.  Heparin level therapeutic this morning. CBC wnl. Cath planned today.  Goal of Therapy:  Heparin level 0.3-0.7 units/ml Monitor platelets by anticoagulation protocol: Yes   Plan:  Continue heparin drip at 1250 units/hr Daily heparin level and CBC ordered Monitor for signs/symptoms of bleed Cath planned today   30, PharmD, BCPS Please check AMION for all [facility] contact numbers Clinical Pharmacist 06/05/2021 10:50 AM

## 2021-06-05 NOTE — H&P (View-Only) (Signed)
DAILY PROGRESS NOTE   Patient Name: Joshua Flynn Date of Encounter: 06/05/2021 Cardiologist: None  Chief Complaint   No chest pain  Patient Profile   36 yo male with known CAD (non-obstructive) by cath in 2018, presents with severe chest pain and elevated troponin, consistent with NSTEMI  Subjective   No further chest pain overnight. Took trazodone and got some sleep- on heparin and 15 ug/hr nitro gtts. BP normal to low normal. Hypokalemic today at 3.3. Plan for LHC today. Troponin is downtrending.  Objective   Vitals:   06/05/21 0331 06/05/21 0414 06/05/21 0500 06/05/21 0645  BP: (!) 101/49 105/64 120/76 111/70  Pulse: 72 69 74 70  Resp: 14 15 (!) 22 19  Temp:      TempSrc:      SpO2: 95% 95% 96% 95%  Weight:      Height:        Intake/Output Summary (Last 24 hours) at 06/05/2021 1950 Last data filed at 06/04/2021 2320 Gross per 24 hour  Intake 1.96 ml  Output --  Net 1.96 ml   Filed Weights   06/04/21 1735  Weight: (!) 137 kg    Physical Exam   General appearance: alert, no distress, and morbidly obese Neck: no carotid bruit, no JVD, and thyroid not enlarged, symmetric, no tenderness/mass/nodules Lungs: clear to auscultation bilaterally Heart: regular rate and rhythm, S1, S2 normal, no murmur, click, rub or gallop Abdomen: soft, non-tender; bowel sounds normal; no masses,  no organomegaly Extremities: extremities normal, atraumatic, no cyanosis or edema Pulses: 2+ and symmetric Skin: Skin color, texture, turgor normal. No rashes or lesions Neurologic: Grossly normal Psych: Pleasant  Inpatient Medications    Scheduled Meds:  aspirin EC  81 mg Oral Daily   atorvastatin  80 mg Oral Daily   buPROPion  300 mg Oral Daily   clopidogrel  75 mg Oral Daily   DULoxetine  60 mg Oral BID   gabapentin  600 mg Oral BID   metoprolol tartrate  25 mg Oral BID   sodium chloride flush  3 mL Intravenous Q12H   traZODone  200 mg Oral QHS    Continuous Infusions:   sodium chloride     sodium chloride 1 mL/kg/hr (06/05/21 0624)   heparin 1,550 Units/hr (06/05/21 0334)   nitroGLYCERIN 15 mcg/min (06/05/21 0211)    PRN Meds: sodium chloride, acetaminophen, ALPRAZolam, nitroGLYCERIN, ondansetron (ZOFRAN) IV, sodium chloride flush, zolpidem   Labs   Results for orders placed or performed during the hospital encounter of 06/04/21 (from the past 48 hour(s))  Basic metabolic panel     Status: Abnormal   Collection Time: 06/04/21  5:30 PM  Result Value Ref Range   Sodium 137 135 - 145 mmol/L   Potassium 3.3 (L) 3.5 - 5.1 mmol/L   Chloride 99 98 - 111 mmol/L   CO2 27 22 - 32 mmol/L   Glucose, Bld 107 (H) 70 - 99 mg/dL    Comment: Glucose reference range applies only to samples taken after fasting for at least 8 hours.   BUN 9 6 - 20 mg/dL   Creatinine, Ser 9.32 0.61 - 1.24 mg/dL   Calcium 9.1 8.9 - 67.1 mg/dL   GFR, Estimated >24 >58 mL/min    Comment: (NOTE) Calculated using the CKD-EPI Creatinine Equation (2021)    Anion gap 11 5 - 15    Comment: Performed at [facility], [address] [place], [place] [address]  CBC  Status: None   Collection Time: 06/04/21  5:30 PM  Result Value Ref Range   WBC 10.4 4.0 - 10.5 K/uL   RBC 4.69 4.22 - 5.81 MIL/uL   Hemoglobin 14.6 13.0 - 17.0 g/dL   HCT 32.4 40.1 - 02.7 %   MCV 91.5 80.0 - 100.0 fL   MCH 31.1 26.0 - 34.0 pg   MCHC 34.0 30.0 - 36.0 g/dL   RDW 25.3 66.4 - 40.3 %   Platelets 214 150 - 400 K/uL   nRBC 0.0 0.0 - 0.2 %    Comment: Performed at [facility], [address], [place], [place] [address]  Troponin I (High Sensitivity)     Status: Abnormal   Collection Time: 06/04/21  5:30 PM  Result Value Ref Range   Troponin I (High Sensitivity) 7,445 (HH) <18 ng/L    Comment: CRITICAL RESULT CALLED TO, READ BACK BY AND VERIFIED WITH: Carma Leaven, RN ON1/31/23 @ 1842 BY APPIAH D (NOTE) Elevated high sensitivity troponin I (hsTnI) values and significant  changes across serial  measurements may suggest ACS but many other  chronic and acute conditions are known to elevate hsTnI results.  Refer to the Links section for chest pain algorithms and additional  guidance. Performed at [facility], [address], [place], [place] [address]   Resp Panel by RT-PCR (Flu A&B, Covid) Nasopharyngeal Swab     Status: None   Collection Time: 06/04/21  5:52 PM   Specimen: Nasopharyngeal Swab; Nasopharyngeal(NP) swabs in vial transport medium  Result Value Ref Range   SARS Coronavirus 2 by RT PCR NEGATIVE NEGATIVE    Comment: (NOTE) SARS-CoV-2 target nucleic acids are NOT DETECTED.  The SARS-CoV-2 RNA is generally detectable in upper respiratory specimens during the acute phase of infection. The lowest concentration of SARS-CoV-2 viral copies this assay can detect is 138 copies/mL. A negative result does not preclude SARS-Cov-2 infection and should not be used as the sole basis for treatment or other patient management decisions. A negative result may occur with  improper specimen collection/handling, submission of specimen other than nasopharyngeal swab, presence of viral mutation(s) within the areas targeted by this assay, and inadequate number of viral copies(<138 copies/mL). A negative result must be combined with clinical observations, patient history, and epidemiological information. The expected result is Negative.  Fact Sheet for Patients:  BloggerCourse.com  Fact Sheet for Healthcare Providers:  SeriousBroker.it  This test is no t yet approved or cleared by the [place] FDA and  has been authorized for detection and/or diagnosis of SARS-CoV-2 by FDA under an Emergency Use Authorization (EUA). This EUA will remain  in effect (meaning this test can be used) for the duration of the COVID-19 declaration under Section 564(b)(1) of the Act, 21 U.S.C.section 360bbb-3(b)(1), unless the authorization is  terminated  or revoked sooner.       Influenza A by PCR NEGATIVE NEGATIVE   Influenza B by PCR NEGATIVE NEGATIVE    Comment: (NOTE) The Xpert Xpress SARS-CoV-2/FLU/RSV plus assay is intended as an aid in the diagnosis of influenza from Nasopharyngeal swab specimens and should not be used as a sole basis for treatment. Nasal washings and aspirates are unacceptable for Xpert Xpress SARS-CoV-2/FLU/RSV testing.  Fact Sheet for Patients: BloggerCourse.com  Fact Sheet for Healthcare Providers: SeriousBroker.it  This test is not yet approved or cleared by the [place] FDA and has been authorized for detection and/or diagnosis of SARS-CoV-2 by FDA under an Emergency Use Authorization (  EUA). This EUA will remain in effect (meaning this test can be used) for the duration of the COVID-19 declaration under Section 564(b)(1) of the Act, 21 U.S.C. section 360bbb-3(b)(1), unless the authorization is terminated or revoked.  Performed at [facility], [address] [place], [place] [address]   Hepatic function panel     Status: Abnormal   Collection Time: 06/04/21  8:00 PM  Result Value Ref Range   Total Protein 6.6 6.5 - 8.1 g/dL   Albumin 3.8 3.5 - 5.0 g/dL   AST 67 (H) 15 - 41 U/L   ALT 105 (H) 0 - 44 U/L   Alkaline Phosphatase 61 38 - 126 U/L   Total Bilirubin 0.7 0.3 - 1.2 mg/dL   Bilirubin, Direct 0.1 0.0 - 0.2 mg/dL   Indirect Bilirubin 0.6 mg/dL    Comment: Performed at [facility], [address] [place], [place] [address]  Lipase, blood     Status: None   Collection Time: 06/04/21  8:00 PM  Result Value Ref Range   Lipase 32 11 - 51 U/L    Comment: Performed at [facility], [address] [place], [place] [address]  Troponin I (High Sensitivity)     Status: Abnormal   Collection Time: 06/04/21  8:00 PM  Result Value Ref Range   Troponin I (High Sensitivity) 6,007 (HH) <18 ng/L    Comment:  CRITICAL VALUE NOTED.  VALUE IS CONSISTENT WITH PREVIOUSLY REPORTED AND CALLED VALUE. (NOTE) Elevated high sensitivity troponin I (hsTnI) values and significant  changes across serial measurements may suggest ACS but many other  chronic and acute conditions are known to elevate hsTnI results.  Refer to the Links section for chest pain algorithms and additional  guidance. Performed at [facility], [address], [place], [place] [address]   HIV Antibody (routine testing w rflx)     Status: None   Collection Time: 06/04/21  9:35 PM  Result Value Ref Range   HIV Screen 4th Generation wRfx Non Reactive Non Reactive    Comment: Performed at [facility], [address], [place], [place] [address]  Magnesium     Status: None   Collection Time: 06/04/21  9:35 PM  Result Value Ref Range   Magnesium 1.9 1.7 - 2.4 mg/dL    Comment: Performed at [facility], [address] [place], [place] [address]  TSH     Status: None   Collection Time: 06/04/21 10:32 PM  Result Value Ref Range   TSH 3.408 0.350 - 4.500 uIU/mL    Comment: Performed by a 3rd Generation assay with a functional sensitivity of <=0.01 uIU/mL. Performed at [facility], [address] [place], [place] [address]   Troponin I (High Sensitivity)     Status: Abnormal   Collection Time: 06/04/21 10:32 PM  Result Value Ref Range   Troponin I (High Sensitivity) 3,768 (HH) <18 ng/L    Comment: CRITICAL VALUE NOTED.  VALUE IS CONSISTENT WITH PREVIOUSLY REPORTED AND CALLED VALUE. (NOTE) Elevated high sensitivity troponin I (hsTnI) values and significant  changes across serial measurements may suggest ACS but many other  chronic and acute conditions are known to elevate hsTnI results.  Refer to the Links section for chest pain algorithms and additional  guidance. Performed at [facility], [address] [place], [place] [address]   Troponin I (High Sensitivity)     Status: Abnormal  Collection  Time: 06/05/21 12:00 AM  Result Value Ref Range   Troponin I (High Sensitivity) 3,045 (HH) <18 ng/L    Comment: CRITICAL VALUE NOTED.  VALUE IS CONSISTENT WITH PREVIOUSLY REPORTED AND CALLED VALUE. (NOTE) Elevated high sensitivity troponin I (hsTnI) values and significant  changes across serial measurements may suggest ACS but many other  chronic and acute conditions are known to elevate hsTnI results.  Refer to the Links section for chest pain algorithms and additional  guidance. Performed at [facility], [address], [place], [place] [address]   Heparin level (unfractionated)     Status: Abnormal   Collection Time: 06/05/21  2:08 AM  Result Value Ref Range   Heparin Unfractionated 0.19 (L) 0.30 - 0.70 IU/mL    Comment: (NOTE) The clinical reportable range upper limit is being lowered to >1.10 to align with the FDA approved guidance for the current laboratory assay.  If heparin results are below expected values, and patient dosage has  been confirmed, suggest follow up testing of antithrombin III levels. Performed at [facility], [address] [place], [place] [address]   Heparin level (unfractionated)     Status: None   Collection Time: 06/05/21  6:09 AM  Result Value Ref Range   Heparin Unfractionated 0.34 0.30 - 0.70 IU/mL    Comment: (NOTE) The clinical reportable range upper limit is being lowered to >1.10 to align with the FDA approved guidance for the current laboratory assay.  If heparin results are below expected values, and patient dosage has  been confirmed, suggest follow up testing of antithrombin III levels. Performed at [facility], [address] [place], [place] [address]   CBC     Status: None   Collection Time: 06/05/21  6:10 AM  Result Value Ref Range   WBC 6.9 4.0 - 10.5 K/uL   RBC 4.27 4.22 - 5.81 MIL/uL   Hemoglobin 13.3 13.0 - 17.0 g/dL   HCT 57.8 46.9 - 62.9 %   MCV 91.3 80.0 - 100.0 fL   MCH 31.1 26.0 - 34.0 pg    MCHC 34.1 30.0 - 36.0 g/dL   RDW 52.8 41.3 - 24.4 %   Platelets 162 150 - 400 K/uL   nRBC 0.0 0.0 - 0.2 %    Comment: Performed at [facility], [address], [place], [place] [address]  Basic metabolic panel     Status: Abnormal   Collection Time: 06/05/21  6:10 AM  Result Value Ref Range   Sodium 137 135 - 145 mmol/L   Potassium 3.3 (L) 3.5 - 5.1 mmol/L   Chloride 100 98 - 111 mmol/L   CO2 27 22 - 32 mmol/L   Glucose, Bld 163 (H) 70 - 99 mg/dL    Comment: Glucose reference range applies only to samples taken after fasting for at least 8 hours.   BUN 9 6 - 20 mg/dL   Creatinine, Ser 2.53 0.61 - 1.24 mg/dL   Calcium 8.8 (L) 8.9 - 10.3 mg/dL   GFR, Estimated >66 >44 mL/min    Comment: (NOTE) Calculated using the CKD-EPI Creatinine Equation (2021)    Anion gap 10 5 - 15    Comment: Performed at [facility], [address] [place], [place] [address]    ECG   Sinus rhythm at 81, inferior Q waves - Personally Reviewed  Telemetry   Sinus rhythm, occasional PVC's - Personally Reviewed  Radiology  DG Chest Portable 1 View  Result Date: 06/04/2021 CLINICAL DATA:  Chest pain EXAM: PORTABLE CHEST 1 VIEW COMPARISON:  06/04/2021 FINDINGS: The heart size and mediastinal contours are within normal limits. Both lungs are clear. The visualized skeletal structures are unremarkable. IMPRESSION: No active disease. Electronically Signed   By: Marlan Palau M.D.   On: 06/04/2021 18:18    Cardiac Studies   Echo pending  Assessment   Principal Problem:   NSTEMI (non-ST elevated myocardial infarction) (HCC) Active Problems:   HTN (hypertension)   Dyslipidemia, goal LDL below 55   Neuropathy   Depression   CAD in native artery   OSA on CPAP   Morbid obesity (HCC)   Plan   No further chest pain overnight. Troponin is downtrending. Will replete potassium. Plan for LHC today - explained risks, benefits and alternatives of cath - he is familiar with the procedure, he  comprehends and accepts the risks/benefits and is willing to proceed today.  Time Spent Directly with Patient:  I have spent a total of 25 minutes with the patient reviewing hospital notes, telemetry, EKGs, labs and examining the patient as well as establishing an assessment and plan that was discussed personally with the patient.  > 50% of time was spent in direct patient care.  Length of Stay:  LOS: 1 day   Chrystie Nose, MD, [facility], FACP  [place]   [facility]  Medical Director of the [facility] Lipid [facility]  Cardiovascular Risk Reduction Clinic Diplomate of the American Board of Clinical Lipidology Attending Cardiologist  Direct Dial: [phone]   Fax: [phone]  Website:  [email or web address] 06/05/2021, 9:07 AM

## 2021-06-05 NOTE — Progress Notes (Signed)
DAILY PROGRESS NOTE   Patient Name: Joshua Flynn Date of Encounter: 06/05/2021 Cardiologist: None  Chief Complaint   No chest pain  Patient Profile   36 yo male with known CAD (non-obstructive) by cath in 2018, presents with severe chest pain and elevated troponin, consistent with NSTEMI  Subjective   No further chest pain overnight. Took trazodone and got some sleep- on heparin and 15 ug/hr nitro gtts. BP normal to low normal. Hypokalemic today at 3.3. Plan for LHC today. Troponin is downtrending.  Objective   Vitals:   06/05/21 0331 06/05/21 0414 06/05/21 0500 06/05/21 0645  BP: (!) 101/49 105/64 120/76 111/70  Pulse: 72 69 74 70  Resp: 14 15 (!) 22 19  Temp:      TempSrc:      SpO2: 95% 95% 96% 95%  Weight:      Height:        Intake/Output Summary (Last 24 hours) at 06/05/2021 1950 Last data filed at 06/04/2021 2320 Gross per 24 hour  Intake 1.96 ml  Output --  Net 1.96 ml   Filed Weights   06/04/21 1735  Weight: (!) 137 kg    Physical Exam   General appearance: alert, no distress, and morbidly obese Neck: no carotid bruit, no JVD, and thyroid not enlarged, symmetric, no tenderness/mass/nodules Lungs: clear to auscultation bilaterally Heart: regular rate and rhythm, S1, S2 normal, no murmur, click, rub or gallop Abdomen: soft, non-tender; bowel sounds normal; no masses,  no organomegaly Extremities: extremities normal, atraumatic, no cyanosis or edema Pulses: 2+ and symmetric Skin: Skin color, texture, turgor normal. No rashes or lesions Neurologic: Grossly normal Psych: Pleasant  Inpatient Medications    Scheduled Meds:  aspirin EC  81 mg Oral Daily   atorvastatin  80 mg Oral Daily   buPROPion  300 mg Oral Daily   clopidogrel  75 mg Oral Daily   DULoxetine  60 mg Oral BID   gabapentin  600 mg Oral BID   metoprolol tartrate  25 mg Oral BID   sodium chloride flush  3 mL Intravenous Q12H   traZODone  200 mg Oral QHS    Continuous Infusions:   sodium chloride     sodium chloride 1 mL/kg/hr (06/05/21 0624)   heparin 1,550 Units/hr (06/05/21 0334)   nitroGLYCERIN 15 mcg/min (06/05/21 0211)    PRN Meds: sodium chloride, acetaminophen, ALPRAZolam, nitroGLYCERIN, ondansetron (ZOFRAN) IV, sodium chloride flush, zolpidem   Labs   Results for orders placed or performed during the hospital encounter of 06/04/21 (from the past 48 hour(s))  Basic metabolic panel     Status: Abnormal   Collection Time: 06/04/21  5:30 PM  Result Value Ref Range   Sodium 137 135 - 145 mmol/L   Potassium 3.3 (L) 3.5 - 5.1 mmol/L   Chloride 99 98 - 111 mmol/L   CO2 27 22 - 32 mmol/L   Glucose, Bld 107 (H) 70 - 99 mg/dL    Comment: Glucose reference range applies only to samples taken after fasting for at least 8 hours.   BUN 9 6 - 20 mg/dL   Creatinine, Ser 9.32 0.61 - 1.24 mg/dL   Calcium 9.1 8.9 - 67.1 mg/dL   GFR, Estimated >24 >58 mL/min    Comment: (NOTE) Calculated using the CKD-EPI Creatinine Equation (2021)    Anion gap 11 5 - 15    Comment: Performed at [facility], [address] [place], [place] [address]  CBC  Status: None   Collection Time: 06/04/21  5:30 PM  Result Value Ref Range   WBC 10.4 4.0 - 10.5 K/uL   RBC 4.69 4.22 - 5.81 MIL/uL   Hemoglobin 14.6 13.0 - 17.0 g/dL   HCT 32.4 40.1 - 02.7 %   MCV 91.5 80.0 - 100.0 fL   MCH 31.1 26.0 - 34.0 pg   MCHC 34.0 30.0 - 36.0 g/dL   RDW 25.3 66.4 - 40.3 %   Platelets 214 150 - 400 K/uL   nRBC 0.0 0.0 - 0.2 %    Comment: Performed at [facility], [address], [place], [place] [address]  Troponin I (High Sensitivity)     Status: Abnormal   Collection Time: 06/04/21  5:30 PM  Result Value Ref Range   Troponin I (High Sensitivity) 7,445 (HH) <18 ng/L    Comment: CRITICAL RESULT CALLED TO, READ BACK BY AND VERIFIED WITH: Carma Leaven, RN ON1/31/23 @ 1842 BY APPIAH D (NOTE) Elevated high sensitivity troponin I (hsTnI) values and significant  changes across serial  measurements may suggest ACS but many other  chronic and acute conditions are known to elevate hsTnI results.  Refer to the Links section for chest pain algorithms and additional  guidance. Performed at [facility], [address], [place], [place] [address]   Resp Panel by RT-PCR (Flu A&B, Covid) Nasopharyngeal Swab     Status: None   Collection Time: 06/04/21  5:52 PM   Specimen: Nasopharyngeal Swab; Nasopharyngeal(NP) swabs in vial transport medium  Result Value Ref Range   SARS Coronavirus 2 by RT PCR NEGATIVE NEGATIVE    Comment: (NOTE) SARS-CoV-2 target nucleic acids are NOT DETECTED.  The SARS-CoV-2 RNA is generally detectable in upper respiratory specimens during the acute phase of infection. The lowest concentration of SARS-CoV-2 viral copies this assay can detect is 138 copies/mL. A negative result does not preclude SARS-Cov-2 infection and should not be used as the sole basis for treatment or other patient management decisions. A negative result may occur with  improper specimen collection/handling, submission of specimen other than nasopharyngeal swab, presence of viral mutation(s) within the areas targeted by this assay, and inadequate number of viral copies(<138 copies/mL). A negative result must be combined with clinical observations, patient history, and epidemiological information. The expected result is Negative.  Fact Sheet for Patients:  BloggerCourse.com  Fact Sheet for Healthcare Providers:  SeriousBroker.it  This test is no t yet approved or cleared by the [place] FDA and  has been authorized for detection and/or diagnosis of SARS-CoV-2 by FDA under an Emergency Use Authorization (EUA). This EUA will remain  in effect (meaning this test can be used) for the duration of the COVID-19 declaration under Section 564(b)(1) of the Act, 21 U.S.C.section 360bbb-3(b)(1), unless the authorization is  terminated  or revoked sooner.       Influenza A by PCR NEGATIVE NEGATIVE   Influenza B by PCR NEGATIVE NEGATIVE    Comment: (NOTE) The Xpert Xpress SARS-CoV-2/FLU/RSV plus assay is intended as an aid in the diagnosis of influenza from Nasopharyngeal swab specimens and should not be used as a sole basis for treatment. Nasal washings and aspirates are unacceptable for Xpert Xpress SARS-CoV-2/FLU/RSV testing.  Fact Sheet for Patients: BloggerCourse.com  Fact Sheet for Healthcare Providers: SeriousBroker.it  This test is not yet approved or cleared by the [place] FDA and has been authorized for detection and/or diagnosis of SARS-CoV-2 by FDA under an Emergency Use Authorization (  EUA). This EUA will remain in effect (meaning this test can be used) for the duration of the COVID-19 declaration under Section 564(b)(1) of the Act, 21 U.S.C. section 360bbb-3(b)(1), unless the authorization is terminated or revoked.  Performed at [facility], [address] [place], [place] [address]   Hepatic function panel     Status: Abnormal   Collection Time: 06/04/21  8:00 PM  Result Value Ref Range   Total Protein 6.6 6.5 - 8.1 g/dL   Albumin 3.8 3.5 - 5.0 g/dL   AST 67 (H) 15 - 41 U/L   ALT 105 (H) 0 - 44 U/L   Alkaline Phosphatase 61 38 - 126 U/L   Total Bilirubin 0.7 0.3 - 1.2 mg/dL   Bilirubin, Direct 0.1 0.0 - 0.2 mg/dL   Indirect Bilirubin 0.6 mg/dL    Comment: Performed at [facility], [address] [place], [place] [address]  Lipase, blood     Status: None   Collection Time: 06/04/21  8:00 PM  Result Value Ref Range   Lipase 32 11 - 51 U/L    Comment: Performed at [facility], [address] [place], [place] [address]  Troponin I (High Sensitivity)     Status: Abnormal   Collection Time: 06/04/21  8:00 PM  Result Value Ref Range   Troponin I (High Sensitivity) 6,007 (HH) <18 ng/L    Comment:  CRITICAL VALUE NOTED.  VALUE IS CONSISTENT WITH PREVIOUSLY REPORTED AND CALLED VALUE. (NOTE) Elevated high sensitivity troponin I (hsTnI) values and significant  changes across serial measurements may suggest ACS but many other  chronic and acute conditions are known to elevate hsTnI results.  Refer to the Links section for chest pain algorithms and additional  guidance. Performed at [facility], [address], [place], [place] [address]   HIV Antibody (routine testing w rflx)     Status: None   Collection Time: 06/04/21  9:35 PM  Result Value Ref Range   HIV Screen 4th Generation wRfx Non Reactive Non Reactive    Comment: Performed at [facility], [address], [place], [place] [address]  Magnesium     Status: None   Collection Time: 06/04/21  9:35 PM  Result Value Ref Range   Magnesium 1.9 1.7 - 2.4 mg/dL    Comment: Performed at [facility], [address] [place], [place] [address]  TSH     Status: None   Collection Time: 06/04/21 10:32 PM  Result Value Ref Range   TSH 3.408 0.350 - 4.500 uIU/mL    Comment: Performed by a 3rd Generation assay with a functional sensitivity of <=0.01 uIU/mL. Performed at [facility], [address] [place], [place] [address]   Troponin I (High Sensitivity)     Status: Abnormal   Collection Time: 06/04/21 10:32 PM  Result Value Ref Range   Troponin I (High Sensitivity) 3,768 (HH) <18 ng/L    Comment: CRITICAL VALUE NOTED.  VALUE IS CONSISTENT WITH PREVIOUSLY REPORTED AND CALLED VALUE. (NOTE) Elevated high sensitivity troponin I (hsTnI) values and significant  changes across serial measurements may suggest ACS but many other  chronic and acute conditions are known to elevate hsTnI results.  Refer to the Links section for chest pain algorithms and additional  guidance. Performed at [facility], [address] [place], [place] [address]   Troponin I (High Sensitivity)     Status: Abnormal  Collection  Time: 06/05/21 12:00 AM  Result Value Ref Range   Troponin I (High Sensitivity) 3,045 (HH) <18 ng/L    Comment: CRITICAL VALUE NOTED.  VALUE IS CONSISTENT WITH PREVIOUSLY REPORTED AND CALLED VALUE. (NOTE) Elevated high sensitivity troponin I (hsTnI) values and significant  changes across serial measurements may suggest ACS but many other  chronic and acute conditions are known to elevate hsTnI results.  Refer to the Links section for chest pain algorithms and additional  guidance. Performed at [facility], [address], [place], [place] [address]   Heparin level (unfractionated)     Status: Abnormal   Collection Time: 06/05/21  2:08 AM  Result Value Ref Range   Heparin Unfractionated 0.19 (L) 0.30 - 0.70 IU/mL    Comment: (NOTE) The clinical reportable range upper limit is being lowered to >1.10 to align with the FDA approved guidance for the current laboratory assay.  If heparin results are below expected values, and patient dosage has  been confirmed, suggest follow up testing of antithrombin III levels. Performed at [facility], [address] [place], [place] [address]   Heparin level (unfractionated)     Status: None   Collection Time: 06/05/21  6:09 AM  Result Value Ref Range   Heparin Unfractionated 0.34 0.30 - 0.70 IU/mL    Comment: (NOTE) The clinical reportable range upper limit is being lowered to >1.10 to align with the FDA approved guidance for the current laboratory assay.  If heparin results are below expected values, and patient dosage has  been confirmed, suggest follow up testing of antithrombin III levels. Performed at [facility], [address] [place], [place] [address]   CBC     Status: None   Collection Time: 06/05/21  6:10 AM  Result Value Ref Range   WBC 6.9 4.0 - 10.5 K/uL   RBC 4.27 4.22 - 5.81 MIL/uL   Hemoglobin 13.3 13.0 - 17.0 g/dL   HCT 57.8 46.9 - 62.9 %   MCV 91.3 80.0 - 100.0 fL   MCH 31.1 26.0 - 34.0 pg    MCHC 34.1 30.0 - 36.0 g/dL   RDW 52.8 41.3 - 24.4 %   Platelets 162 150 - 400 K/uL   nRBC 0.0 0.0 - 0.2 %    Comment: Performed at [facility], [address], [place], [place] [address]  Basic metabolic panel     Status: Abnormal   Collection Time: 06/05/21  6:10 AM  Result Value Ref Range   Sodium 137 135 - 145 mmol/L   Potassium 3.3 (L) 3.5 - 5.1 mmol/L   Chloride 100 98 - 111 mmol/L   CO2 27 22 - 32 mmol/L   Glucose, Bld 163 (H) 70 - 99 mg/dL    Comment: Glucose reference range applies only to samples taken after fasting for at least 8 hours.   BUN 9 6 - 20 mg/dL   Creatinine, Ser 2.53 0.61 - 1.24 mg/dL   Calcium 8.8 (L) 8.9 - 10.3 mg/dL   GFR, Estimated >66 >44 mL/min    Comment: (NOTE) Calculated using the CKD-EPI Creatinine Equation (2021)    Anion gap 10 5 - 15    Comment: Performed at [facility], [address] [place], [place] [address]    ECG   Sinus rhythm at 81, inferior Q waves - Personally Reviewed  Telemetry   Sinus rhythm, occasional PVC's - Personally Reviewed  Radiology  DG Chest Portable 1 View ° °Result Date: 06/04/2021 °CLINICAL DATA:  Chest pain EXAM: PORTABLE CHEST 1 VIEW COMPARISON:  06/04/2021 FINDINGS: The heart size and mediastinal contours are within normal limits. Both lungs are clear. The visualized skeletal structures are unremarkable. IMPRESSION: No active disease. Electronically Signed   By: Charles  Clark M.D.   On: 06/04/2021 18:18   ° °Cardiac Studies  ° °Echo pending ° °Assessment  ° °Principal Problem: °  NSTEMI (non-ST elevated myocardial infarction) (HCC) °Active Problems: °  HTN (hypertension) °  Dyslipidemia, goal LDL below 55 °  Neuropathy °  Depression °  CAD in native artery °  OSA on CPAP °  Morbid obesity (HCC) ° ° °Plan  ° °No further chest pain overnight. Troponin is downtrending. Will replete potassium. Plan for LHC today - explained risks, benefits and alternatives of cath - he is familiar with the procedure, he  comprehends and accepts the risks/benefits and is willing to proceed today. ° °Time Spent Directly with Patient: ° °I have spent a total of 25 minutes with the patient reviewing hospital notes, telemetry, EKGs, labs and examining the patient as well as establishing an assessment and plan that was discussed personally with the patient.  > 50% of time was spent in direct patient care. ° °Length of Stay: ° LOS: 1 day  ° °Oluwanifemi Petitti C. Kian Gamarra, MD, FACC, FACP  °[place]   [facility]  °Medical Director of the [facility] &  °Cardiovascular Risk Reduction Clinic °Diplomate of the American Board of Clinical Lipidology °Attending Cardiologist  °Direct Dial: [phone]   Fax: [phone]  °Website:  [email or web address] ° °Jilene Spohr C Ojas Coone °06/05/2021, 9:07 AM ° ° °

## 2021-06-05 NOTE — Progress Notes (Signed)
Echocardiogram 2D Echocardiogram has been performed.  Joshua Flynn 06/05/2021, 11:40 AM

## 2021-06-05 NOTE — Interval H&P Note (Signed)
History and Physical Interval Note:  06/05/2021 12:06 PM  Joshua Flynn  has presented today for surgery, with the diagnosis of NSTEMI.  The various methods of treatment have been discussed with the patient and family. After consideration of risks, benefits and other options for treatment, the patient has consented to  Procedure(s): LEFT HEART CATH AND CORONARY ANGIOGRAPHY (N/A)  PERCUTANEOUS CORONARY INTERVENTION  as a surgical intervention.  The patient's history has been reviewed, patient examined, no change in status, stable for surgery.  I have reviewed the patient's chart and labs.  Questions were answered to the patient's satisfaction.    Cath Lab Visit (complete for each Cath Lab visit)  Clinical Evaluation Leading to the Procedure:   ACS: Yes.    Non-ACS:    Anginal Classification: CCS IV  Anti-ischemic medical therapy: Minimal Therapy (1 class of medications)  Non-Invasive Test Results: No non-invasive testing performed  Prior CABG: No previous CABG   Bryan Lemma

## 2021-06-05 NOTE — Progress Notes (Signed)
°  Transition of Care [facility]) Screening Note   Patient Details  Name: Joshua Flynn Date of Birth: 08/30/1985   Transition of Care [facility]) CM/SW Contact:    Milas Gain, [place] Phone Number: 06/05/2021, 2:17 PM    Transition of Care [facility]) has reviewed patient and no TOC needs have been identified at this time. We will continue to monitor patient advancement through interdisciplinary progression rounds. If new patient transition needs arise, please place a TOC consult.

## 2021-06-05 NOTE — ED Notes (Signed)
Consent at bedside.  

## 2021-06-06 ENCOUNTER — Other Ambulatory Visit (HOSPITAL_COMMUNITY): Payer: Self-pay

## 2021-06-06 ENCOUNTER — Encounter (HOSPITAL_COMMUNITY): Payer: Self-pay | Admitting: Cardiology

## 2021-06-06 DIAGNOSIS — I219 Acute myocardial infarction, unspecified: Secondary | ICD-10-CM

## 2021-06-06 LAB — LIPID PANEL
Cholesterol: 136 mg/dL (ref 0–200)
HDL: 25 mg/dL — ABNORMAL LOW (ref >40)
LDL Cholesterol: 81 mg/dL (ref 0–99)
Total CHOL/HDL Ratio: 5.4 RATIO
Triglycerides: 152 mg/dL — ABNORMAL HIGH (ref <150)
VLDL: 30 mg/dL (ref 0–40)

## 2021-06-06 LAB — BASIC METABOLIC PANEL
Anion gap: 8 (ref 5–15)
BUN: 9 mg/dL (ref 6–20)
CO2: 26 mmol/L (ref 22–32)
Calcium: 9 mg/dL (ref 8.9–10.3)
Chloride: 104 mmol/L (ref 98–111)
Creatinine, Ser: 0.95 mg/dL (ref 0.61–1.24)
GFR, Estimated: >60 mL/min (ref >60)
Glucose, Bld: 181 mg/dL — ABNORMAL HIGH (ref 70–99)
Potassium: 4.1 mmol/L (ref 3.5–5.1)
Sodium: 138 mmol/L (ref 135–145)

## 2021-06-06 LAB — MAGNESIUM: Magnesium: 2 mg/dL (ref 1.7–2.4)

## 2021-06-06 MED ORDER — NITROGLYCERIN 0.4 MG SL SUBL: 0.4000 mg | SUBLINGUAL_TABLET | SUBLINGUAL | 3 refills | Status: DC | PRN

## 2021-06-06 MED ORDER — AMLODIPINE BESYLATE 2.5 MG PO TABS
2.5000 mg | ORAL_TABLET | Freq: Every day | ORAL | 0 refills | Status: AC
Start: 2021-06-07 — End: ?
  Filled 2021-06-06: qty 14, 14d supply, fill #0

## 2021-06-06 MED ORDER — EZETIMIBE 10 MG PO TABS: 10.0000 mg | ORAL_TABLET | Freq: Every day | ORAL | 0 refills | Status: DC

## 2021-06-06 MED ORDER — NITROGLYCERIN 0.4 MG SL SUBL
0.4000 mg | SUBLINGUAL_TABLET | SUBLINGUAL | 0 refills | Status: AC | PRN
Start: 2021-06-06 — End: ?
  Filled 2021-06-06: qty 25, 7d supply, fill #0

## 2021-06-06 MED ORDER — METOPROLOL TARTRATE 50 MG PO TABS: 50.0000 mg | ORAL_TABLET | Freq: Two times a day (BID) | ORAL | 0 refills | Status: DC

## 2021-06-06 MED ORDER — EZETIMIBE 10 MG PO TABS
10.0000 mg | ORAL_TABLET | Freq: Every day | ORAL | 0 refills | Status: AC
  Filled 2021-06-06: qty 14, 14d supply, fill #0

## 2021-06-06 MED ORDER — ISOSORBIDE MONONITRATE ER 60 MG PO TB24: 120.0000 mg | ORAL_TABLET | Freq: Every day | ORAL | Status: DC

## 2021-06-06 MED ORDER — METOPROLOL TARTRATE 50 MG PO TABS: 50.0000 mg | ORAL_TABLET | Freq: Two times a day (BID) | ORAL | Status: DC

## 2021-06-06 MED ORDER — AMLODIPINE BESYLATE 2.5 MG PO TABS: 2.5000 mg | ORAL_TABLET | Freq: Every day | ORAL | 0 refills | Status: DC

## 2021-06-06 MED ORDER — METOPROLOL TARTRATE 50 MG PO TABS
50.0000 mg | ORAL_TABLET | Freq: Two times a day (BID) | ORAL | 0 refills | Status: AC
Start: 2021-06-06 — End: ?
  Filled 2021-06-06: qty 28, 14d supply, fill #0

## 2021-06-06 NOTE — Discharge Summary (Addendum)
Discharge Summary    Patient ID: Joshua Flynn MRN: [id number]; DOB: 1985-09-08  Admit date: 06/04/2021 Discharge date: 06/06/2021  PCP:  Pcp, No   [facility] Providers Cardiologist: [facility] Click here to update MD or APP on Care Team, Refresh:1}     Discharge Diagnoses    Principal Problem:   Myocardial infarction with nonobstructive coronary arteries ([place]) Active Problems:   HTN (hypertension)   Dyslipidemia, goal LDL below 55   Neuropathy   Depression   CAD in native artery   OSA on CPAP   Morbid obesity [facility])    Diagnostic Studies/Procedures     Cardiac Cath 06/05/21   Mid RCA to Dist RCA lesion is 45% stenosed.   Prox LAD to Mid LAD lesion is 30% stenosed with 40% stenosed side branch in 1st Diag.  Mid LAD lesion is 50% stenosed beyond 1st Diag. ->  Not flow-limiting, would not cause significant troponin elevation.   The left ventricular systolic function is normal.  The left ventricular ejection fraction is 55-65% by visual estimate.   LV end diastolic pressure is normal.   There is no aortic valve stenosis.   SUMMARY No obvious culprit lesion to explain non-STEMI with troponins in the 7000 peak range =-> most significant lesion in his mid LAD 40 to 50% stenosis just after major diagonal branch. Otherwise diffuse mild CAD. Normal LVEF with normal wall motion, and normal LV EDP     RECOMMENDATIONS We will consider evaluation for different etiology for significant chest pain and elevated troponins-consider coronary spasm versus pericarditis/myocarditis. Have added amlodipine for possible spasm.     Glenetta Hew, MD   2D echo 06/05/21  1. Left ventricular ejection fraction, by estimation, is 55 to 60%. The  left ventricle has normal function. The left ventricle has no regional  wall motion abnormalities. Left ventricular diastolic parameters are  consistent with Grade I diastolic  dysfunction (impaired relaxation).   2. Right ventricular systolic  function is normal. The right ventricular  size is normal.   3. The mitral valve is normal in structure. No evidence of mitral valve  regurgitation. No evidence of mitral stenosis.   4. The aortic valve is normal in structure. Aortic valve regurgitation is  trivial. No aortic stenosis is present.   5. The inferior vena cava is normal in size with greater than 50%  respiratory variability, suggesting right atrial pressure of 3 mmHg.   Comparison(s): No prior Echocardiogram.  _____________   History of Present Illness     Joshua Flynn is a 36 y.o. male with nonobstructive CAD by cath 2018, HTN, dyslipidemia, tobacco and ETOH use, depression, OSA on CPAP who presented to [facility] with chest pain.  He has a prior history of NSTEMI in 2018 with nonbstructive CAD. He had undergone cardiac catheterization at [facility] in [place], [place] after being transferred from the [facility] ER. According to the records at that time he had persistent chest pain and was admitted initially for pericarditis however echo and CRP were normal. Troponin peaked at 0.25. CT was negative for dissection or PE. Cath demonstrated moderate 40 to 50% mid LAD stenosis and distal tapering of the LAD. There was moderately to severe ostial diagonal branch stenosis, moderate 40 to 50% distal left circumflex stenosis of a codominant vessel and diffuse tubular 60% mid RCA stenosis. iFR was performed of the mid RCA which was negative. Aggressive medical management was recommended.  This admission he presented with chest pain that had started the prior  evening. It was described as dull and radiated to the left chest. He took 2-3 Imdur (because he did not have SL NTG at home) prior to arrival to the hospital. It was similar to what he felt in 2018 so he presented to the ER for further evaluation. EKG showed Sinus rhythm at 85, small inferior Q waves. Initial troponin was 7,445. He was started on IV heparin, IV NTG, and provided opiates for additional pain  control. He was admitted to cardiology for further management.  Hospital Course     1. NSTEMI felt due to [facility] with nonobstructive CAD - hsTroponin trend 7445->6007->3768->3045 - 2D echo 06/05/21 showed EF 55-60%, grade 1 DD, normal RV, no significant valvular disease - cardiac cath 06/05/21 showed findings above with 45% mid to distal RCA, 30% prox-mid LAD with 40% stenosed side branch in 1st diag, mid LAD lesion is 50% stenosed beyond 1st diag -> not flow limiting - no obvious culprit lesion to explain NSTEMI, dx classified as [place]A - continued on prior-to-admission ASA, Plavix - home HCTZ was stopped and metoprolol dose reduced to 50mg  BID to allow for addition of amlodipine for possible coronary vasospasm - was on IV NTG earlier this admission, to resume Imdur 120mg  at discharge - will get SL NTG at discharge as well   2. Essential HTN  - BP controlled at discharge, managed in context of the above - given med changes, the patient was instructed to monitor their blood pressure at home and to call if tending to run higher than 130/80  3. Hyperlipidemia, mildly elevated LFTs - lipid profile showed TChol 136, HDL 25, LDL 81, trig 152 - per d/w Dr. Debara Pickett at discharge, plan to continue atorvastatin 80mg  daily and add ezetimibe 10mg  daily - if the patient is tolerating statin at time of follow-up appointment, would consider rechecking liver function/lipid panel in 6-8 weeks - see also #4 regarding follow-up of liver function  4. Transaminitis - LFTs showed AST 67, ALT 105 -> Dr. Debara Pickett felt most likely due to underlying fatty liver disease, recommended to trend as outpatient  5. Depression - per verbal d/w Dr. Debara Pickett, continue prior to admission medications including buproprion, buspirone, Cymbalta and methylphenidate  6. Neuropathy - continued on gabapentin at home dose  7. OSA on CPAP - continue CPAP  8. Hyperglycemia - CBG variable during admission from 107-181 - A1C still pending  but advised to f/u PCP for this  Dr. Debara Pickett has seen and examined the patient today and feels he is stable for discharge. The patient plans to follow up at the [place] with his usual cardiologist there. Dr. Debara Pickett has recommended the patient refrain from returning to work until seeing his cardiologist for clearance to return. Work note given. The patient required printed rx's to take to the [place] (requested 90 day supply) and then a 30-day rx sent to the [place] here at [facility] - we had to print the prescriptions first then send the [facility] rx so that it did not cancel the electronic prescriptions out.   Did the patient have an acute coronary syndrome (MI, NSTEMI, STEMI, etc) this admission?:  Yes                               AHA/ACC Clinical Performance & Quality Measures: Aspirin prescribed? - Yes ADP Receptor Inhibitor (Plavix/Clopidogrel, Brilinta/Ticagrelor or Effient/Prasugrel) prescribed (includes medically managed patients)? - Yes Beta Blocker prescribed? - Yes High Intensity  Statin (Lipitor 40-80mg  or Crestor 20-40mg ) prescribed? - Yes EF assessed during THIS hospitalization? - Yes For EF <40%, was ACEI/ARB prescribed? - Not Applicable (EF >/= [id number]) For EF <40%, Aldosterone Antagonist (Spironolactone or Eplerenone) prescribed? - Not Applicable (EF >/= [id number]) Cardiac Rehab Phase II ordered (including medically managed patients)? - Yes         _____________  Discharge Vitals Blood pressure 130/87, pulse 71, temperature 98.2 F (36.8 C), temperature source Oral, resp. rate 16, height 5\' 10"  (1.778 m), weight 133.9 kg, SpO2 97 %.  Filed Weights   06/04/21 1735 06/06/21 0520  Weight: (!) 137 kg 133.9 kg    Labs & Radiologic Studies    CBC Recent Labs    06/04/21 1730 06/05/21 0610  WBC 10.4 6.9  HGB 14.6 13.3  HCT 42.9 39.0  MCV 91.5 91.3  PLT 214 0000000   Basic Metabolic Panel Recent Labs    06/04/21 2135 06/05/21 0610 06/06/21 1022  NA  --  137 138  K  --  3.3* 4.1  CL  --   100 104  CO2  --  27 26  GLUCOSE  --  163* 181*  BUN  --  9 9  CREATININE  --  0.97 0.95  CALCIUM  --  8.8* 9.0  MG 1.9  --  2.0   Liver Function Tests Recent Labs    06/04/21 2000  AST 67*  ALT 105*  ALKPHOS 61  BILITOT 0.7  PROT 6.6  ALBUMIN 3.8   Recent Labs    06/04/21 2000  LIPASE 32   High Sensitivity Troponin:   Recent Labs  Lab 06/04/21 1730 06/04/21 2000 06/04/21 2232 06/05/21 0000  TROPONINIHS 7,445* 6,007* 3,768* 3,045*    BNP Invalid input(s): POCBNP D-Dimer No results for input(s): DDIMER in the last 72 hours. Hemoglobin A1C No results for input(s): HGBA1C in the last 72 hours. Fasting Lipid Panel Recent Labs    06/06/21 1022  CHOL 136  HDL 25*  LDLCALC 81  TRIG 152*  CHOLHDL 5.4   Thyroid Function Tests Recent Labs    06/04/21 2232  TSH 3.408   _____________  CARDIAC CATHETERIZATION  Result Date: 06/05/2021   Mid RCA to Dist RCA lesion is 45% stenosed.   Prox LAD to Mid LAD lesion is 30% stenosed with 40% stenosed side branch in 1st Diag.  Mid LAD lesion is 50% stenosed beyond 1st Diag. ->  Not flow-limiting, would not cause significant troponin elevation.   The left ventricular systolic function is normal.  The left ventricular ejection fraction is 55-65% by visual estimate.   LV end diastolic pressure is normal.   There is no aortic valve stenosis. SUMMARY No obvious culprit lesion to explain non-STEMI with troponins in the 7000 peak range =-> most significant lesion in his mid LAD 40 to 50% stenosis just after major diagonal branch. Otherwise diffuse mild CAD. Normal LVEF with normal wall motion, and normal LV EDP RECOMMENDATIONS We will consider evaluation for different etiology for significant chest pain and elevated troponins-consider coronary spasm versus pericarditis/myocarditis. Have added amlodipine for possible spasm. Glenetta Hew, MD  DG Chest Portable 1 View  Result Date: 06/04/2021 CLINICAL DATA:  Chest pain EXAM: PORTABLE  CHEST 1 VIEW COMPARISON:  06/04/2021 FINDINGS: The heart size and mediastinal contours are within normal limits. Both lungs are clear. The visualized skeletal structures are unremarkable. IMPRESSION: No active disease. Electronically Signed   By: Franchot Gallo M.D.   On: 06/04/2021 18:18  ECHOCARDIOGRAM COMPLETE  Result Date: 06/05/2021    ECHOCARDIOGRAM REPORT   Patient Name:   TRAVOR KARRIKER Date of Exam: 06/05/2021 Medical Rec #:  [id number]     Height:       70.0 in Accession #:    [id number]    Weight:       302.0 lb Date of Birth:  03-Nov-1985     BSA:          2.487 m Patient Age:    61 years      BP:           111/70 mmHg Patient Gender: M             HR:           70 bpm. Exam Location:  Inpatient Procedure: 2D Echo and Intracardiac Opacification Agent Indications:    NSTEMI  History:        Patient has no prior history of Echocardiogram examinations.                 Acute MI; Risk Factors:Hypertension.  Sonographer:    Arlyss Gandy Referring Phys: 67 RHONDA G BARRETT  Sonographer Comments: Image acquisition challenging due to patient body habitus. IMPRESSIONS  1. Left ventricular ejection fraction, by estimation, is 55 to 60%. The left ventricle has normal function. The left ventricle has no regional wall motion abnormalities. Left ventricular diastolic parameters are consistent with Grade I diastolic dysfunction (impaired relaxation).  2. Right ventricular systolic function is normal. The right ventricular size is normal.  3. The mitral valve is normal in structure. No evidence of mitral valve regurgitation. No evidence of mitral stenosis.  4. The aortic valve is normal in structure. Aortic valve regurgitation is trivial. No aortic stenosis is present.  5. The inferior vena cava is normal in size with greater than 50% respiratory variability, suggesting right atrial pressure of 3 mmHg. Comparison(s): No prior Echocardiogram. FINDINGS  Left Ventricle: Left ventricular ejection fraction, by estimation,  is 55 to 60%. The left ventricle has normal function. The left ventricle has no regional wall motion abnormalities. Definity contrast agent was given IV to delineate the left ventricular  endocardial borders. The left ventricular internal cavity size was normal in size. There is no left ventricular hypertrophy. Left ventricular diastolic parameters are consistent with Grade I diastolic dysfunction (impaired relaxation). Right Ventricle: The right ventricular size is normal. No increase in right ventricular wall thickness. Right ventricular systolic function is normal. Left Atrium: Left atrial size was normal in size. Right Atrium: Right atrial size was normal in size. Pericardium: There is no evidence of pericardial effusion. Mitral Valve: The mitral valve is normal in structure. No evidence of mitral valve regurgitation. No evidence of mitral valve stenosis. Tricuspid Valve: The tricuspid valve is normal in structure. Tricuspid valve regurgitation is not demonstrated. No evidence of tricuspid stenosis. Aortic Valve: The aortic valve is normal in structure. Aortic valve regurgitation is trivial. No aortic stenosis is present. Aortic valve mean gradient measures 3.0 mmHg. Aortic valve peak gradient measures 5.1 mmHg. Aortic valve area, by VTI measures 3.60 cm. Pulmonic Valve: The pulmonic valve was normal in structure. Pulmonic valve regurgitation is not visualized. No evidence of pulmonic stenosis. Aorta: The aortic root is normal in size and structure. Venous: The inferior vena cava is normal in size with greater than 50% respiratory variability, suggesting right atrial pressure of 3 mmHg. IAS/Shunts: No atrial level shunt detected by color flow Doppler.  LEFT VENTRICLE PLAX  2D LVIDd:         4.10 cm   Diastology LVIDs:         2.70 cm   LV e' medial:    9.90 cm/s LV PW:         1.20 cm   LV E/e' medial:  9.5 LV IVS:        1.20 cm   LV e' lateral:   10.70 cm/s LVOT diam:     2.20 cm   LV E/e' lateral: 8.8 LV SV:          88 LV SV Index:   35 LVOT Area:     3.80 cm  RIGHT VENTRICLE RV Basal diam:  3.20 cm RV Mid diam:    2.80 cm RV S prime:     10.80 cm/s TAPSE (M-mode): 2.5 cm LEFT ATRIUM             Index        RIGHT ATRIUM           Index LA diam:        3.70 cm 1.49 cm/m   RA Area:     13.80 cm LA Vol (A2C):   32.9 ml 13.23 ml/m  RA Volume:   28.60 ml  11.50 ml/m LA Vol (A4C):   53.9 ml 21.67 ml/m LA Biplane Vol: 44.5 ml 17.89 ml/m  AORTIC VALVE AV Area (Vmax):    3.77 cm AV Area (Vmean):   3.56 cm AV Area (VTI):     3.60 cm AV Vmax:           113.00 cm/s AV Vmean:          78.300 cm/s AV VTI:            0.245 m AV Peak Grad:      5.1 mmHg AV Mean Grad:      3.0 mmHg LVOT Vmax:         112.00 cm/s LVOT Vmean:        73.400 cm/s LVOT VTI:          0.232 m LVOT/AV VTI ratio: 0.95  AORTA Ao Root diam: 3.10 cm Ao Asc diam:  3.00 cm MITRAL VALVE               TRICUSPID VALVE MV Area (PHT): 3.34 cm    TR Peak grad:   19.5 mmHg MV Decel Time: 227 msec    TR Vmax:        221.00 cm/s MV E velocity: 94.30 cm/s MV A velocity: 72.80 cm/s  SHUNTS MV E/A ratio:  1.30        Systemic VTI:  0.23 m                            Systemic Diam: 2.20 cm Kardie Tobb DO Electronically signed by Berniece Salines DO Signature Date/Time: 06/05/2021/5:19:00 PM    Final    Disposition   Pt is being discharged home today in good condition.  Follow-up Plans & Appointments     Follow-up Information     [facility] Follow up.   Specialty: Cardiology Why: Please follow up with your cardiologist at the [facility] within 2 weeks. If you have any questions about your plan of care from our team, please call the office number listed here. Contact information: [place] [phone]  Discharge Instructions     Amb Referral to Cardiac Rehabilitation   Complete by: As directed    Diagnosis: NSTEMI   After initial evaluation and assessments completed: Virtual  Based Care may be provided alone or in conjunction with Phase 2 Cardiac Rehab based on patient barriers.: Yes   Diet - low sodium heart healthy   Complete by: As directed    Discharge instructions   Complete by: As directed    Please take note of multiple medicine changes.  Please monitor your blood pressure occasionally at home. Call your doctor if you tend to get readings of greater than 130 on the top number or 80 on the bottom number.  Your blood sugar was elevated in the hospital. Hemoglobin A1C remains in process (to assess blood sugar control over the last 3 months). Two of your liver function numbers were also mildly elevated in the hospital - AST 67, ALT 105. This is most commonly due to fatty liver but can also be elevated in the setting of the heart problem you presented to the hospital with. Please follow up with your primary care provider to discuss a plan for monitoring further.   Increase activity slowly   Complete by: As directed    No driving for 1 week. No lifting over 10 lbs for 2 weeks. No sexual activity for 2 weeks. You may not return to work until cleared by your cardiologist. Keep procedure site clean & dry. If you notice increased pain, swelling, bleeding or pus, call/return!  You may shower, but no soaking baths/hot tubs/pools for 1 week.       Discharge Medications   Allergies as of 06/06/2021   No Known Allergies      Medication List     STOP taking these medications    hydrochlorothiazide 50 MG tablet Commonly known as: HYDRODIURIL       TAKE these medications    amLODipine 2.5 MG tablet Commonly known as: NORVASC Take 1 tablet (2.5 mg total) by mouth daily. Start taking on: June 07, 2021   aspirin 81 MG EC tablet Take 81 mg by mouth daily.   atorvastatin 80 MG tablet Commonly known as: LIPITOR Take 80 mg by mouth at bedtime.   buPROPion 300 MG 24 hr tablet Commonly known as: WELLBUTRIN XL Take 300 mg by mouth every morning.    busPIRone 15 MG tablet Commonly known as: BUSPAR Take 15 mg by mouth in the morning and at bedtime.   carboxymethylcellulose 0.5 % Soln Commonly known as: REFRESH PLUS Place 1 drop into both eyes in the morning, at noon, in the evening, and at bedtime.   clopidogrel 75 MG tablet Commonly known as: PLAVIX Take 75 mg by mouth daily.   DULoxetine 60 MG capsule Commonly known as: CYMBALTA Take 60 mg by mouth 2 (two) times daily.   ezetimibe 10 MG tablet Commonly known as: Zetia Take 1 tablet (10 mg total) by mouth daily.   gabapentin 300 MG capsule Commonly known as: NEURONTIN Take 300 mg by mouth 2 (two) times daily.   isosorbide mononitrate 120 MG 24 hr tablet Commonly known as: IMDUR Take 120 mg by mouth daily.   methylphenidate 36 MG CR tablet Commonly known as: CONCERTA Take 36 mg by mouth every morning.   metoprolol tartrate 50 MG tablet Commonly known as: LOPRESSOR Take 1 tablet (50 mg total) by mouth in the morning and at bedtime. What changed:  medication strength how much to take   nitroGLYCERIN  0.4 MG SL tablet Commonly known as: NITROSTAT Place 1 tablet (0.4 mg total) under the tongue every 5 (five) minutes as needed for chest pain (up to 3 doses).   traZODone 100 MG tablet Commonly known as: DESYREL Take 100 mg by mouth at bedtime.           Outstanding Labs/Studies   None ordered Recommendations as above  Duration of Discharge Encounter   Greater than 30 minutes including physician time.  Signed, Charlie Pitter, PA-C 06/06/2021, 2:22 PM

## 2021-06-06 NOTE — Progress Notes (Signed)
CARDIAC REHAB PHASE I   PRE:  Rate/Rhythm: 73 SR    BP: sitting 122/76    SaO2:   MODE:  Ambulation: 470 ft   POST:  Rate/Rhythm: 99 SR    BP: sitting 131/80     SaO2:   Tolerated well, no c/o. Discussed with pt and s.o. MI, restrictions, CAD, diet, vaping cessation, exercise, NTG, and CRPII. Pt receptive. He does not have NTG and needs a prescription. Will refer to [place], ACSM 06/06/2021 9:52 AM

## 2021-06-06 NOTE — Plan of Care (Signed)

## 2021-06-06 NOTE — Progress Notes (Signed)
DAILY PROGRESS NOTE   Patient Name: Joshua Flynn Date of Encounter: 06/06/2021 Cardiologist: None  Chief Complaint   No chest pain  Patient Profile   36 yo male with known CAD (non-obstructive) by cath in 2018, presents with severe chest pain and elevated troponin, consistent with NSTEMI  Subjective   Cath yesterday showed surprisingly no significant obstructive disease - notable distal small vessel disease. Would classify this as MINOCA. Doing well today -ambulating without difficulty.  Objective   Vitals:   06/05/21 2034 06/06/21 0026 06/06/21 0520 06/06/21 0824  BP: 127/77 131/75 121/79 130/87  Pulse: 89 74 81 71  Resp: 14 19 15 16   Temp: 98.4 F (36.9 C) 98.4 F (36.9 C) 98.4 F (36.9 C) 98.2 F (36.8 C)  TempSrc: Oral Oral Oral Oral  SpO2: 96% 96% 96% 97%  Weight:   133.9 kg   Height:        Intake/Output Summary (Last 24 hours) at 06/06/2021 1001 Last data filed at 06/05/2021 2220 Gross per 24 hour  Intake 1136.36 ml  Output --  Net 1136.36 ml   Filed Weights   06/04/21 1735 06/06/21 0520  Weight: (!) 137 kg 133.9 kg    Physical Exam   General appearance: alert, no distress, and morbidly obese Neck: no carotid bruit, no JVD, and thyroid not enlarged, symmetric, no tenderness/mass/nodules Lungs: clear to auscultation bilaterally Heart: regular rate and rhythm, S1, S2 normal, no murmur, click, rub or gallop Abdomen: soft, non-tender; bowel sounds normal; no masses,  no organomegaly Extremities: extremities normal, atraumatic, no cyanosis or edema Pulses: 2+ and symmetric Skin: Skin color, texture, turgor normal. No rashes or lesions Neurologic: Grossly normal Psych: Pleasant  Inpatient Medications    Scheduled Meds:  amLODipine  2.5 mg Oral Daily   aspirin EC  81 mg Oral Daily   atorvastatin  80 mg Oral Daily   buPROPion  300 mg Oral Daily   clopidogrel  75 mg Oral Daily   DULoxetine  60 mg Oral BID   gabapentin  600 mg Oral BID   metoprolol  tartrate  25 mg Oral BID   sodium chloride flush  3 mL Intravenous Q12H   sodium chloride flush  3 mL Intravenous Q12H   traZODone  200 mg Oral QHS    Continuous Infusions:  sodium chloride     sodium chloride      PRN Meds: sodium chloride, sodium chloride, acetaminophen, ALPRAZolam, nitroGLYCERIN, ondansetron (ZOFRAN) IV, sodium chloride flush, sodium chloride flush, zolpidem   Labs   Results for orders placed or performed during the hospital encounter of 06/04/21 (from the past 48 hour(s))  Basic metabolic panel     Status: Abnormal   Collection Time: 06/04/21  5:30 PM  Result Value Ref Range   Sodium 137 135 - 145 mmol/L   Potassium 3.3 (L) 3.5 - 5.1 mmol/L   Chloride 99 98 - 111 mmol/L   CO2 27 22 - 32 mmol/L   Glucose, Bld 107 (H) 70 - 99 mg/dL    Comment: Glucose reference range applies only to samples taken after fasting for at least 8 hours.   BUN 9 6 - 20 mg/dL   Creatinine, Ser 1.03 0.61 - 1.24 mg/dL   Calcium 9.1 8.9 - 10.3 mg/dL   GFR, Estimated >60 >60 mL/min    Comment: (NOTE) Calculated using the CKD-EPI Creatinine Equation (2021)    Anion gap 11 5 - 15    Comment: Performed at [facility], [address] [place] [address]  CBC     Status: None   Collection Time: 06/04/21  5:30 PM  Result Value Ref Range   WBC 10.4 4.0 - 10.5 K/uL   RBC 4.69 4.22 - 5.81 MIL/uL   Hemoglobin 14.6 13.0 - 17.0 g/dL   HCT 42.9 39.0 - 52.0 %   MCV 91.5 80.0 - 100.0 fL   MCH 31.1 26.0 - 34.0 pg   MCHC 34.0 30.0 - 36.0 g/dL   RDW 12.2 11.5 - 15.5 %   Platelets 214 150 - 400 K/uL   nRBC 0.0 0.0 - 0.2 %    Comment: Performed at [facility], [place] [address], [place], [place] [address]  Troponin I (High Sensitivity)     Status: Abnormal   Collection Time: 06/04/21  5:30 PM  Result Value Ref Range   Troponin I (High Sensitivity) 7,445 (HH) <18 ng/L    Comment: CRITICAL RESULT CALLED TO, READ BACK BY AND VERIFIED WITH: Joya San, RN ON1/31/23 @ [id number] BY  APPIAH D (NOTE) Elevated high sensitivity troponin I (hsTnI) values and significant  changes across serial measurements may suggest ACS but many other  chronic and acute conditions are known to elevate hsTnI results.  Refer to the Links section for chest pain algorithms and additional  guidance. Performed at [facility], [place] [address] [place], [place] [address]   Resp Panel by RT-PCR (Flu A&B, Covid) Nasopharyngeal Swab     Status: None   Collection Time: 06/04/21  5:52 PM   Specimen: Nasopharyngeal Swab; Nasopharyngeal(NP) swabs in vial transport medium  Result Value Ref Range   SARS Coronavirus 2 by RT PCR NEGATIVE NEGATIVE    Comment: (NOTE) SARS-CoV-2 target nucleic acids are NOT DETECTED.  The SARS-CoV-2 RNA is generally detectable in upper respiratory specimens during the acute phase of infection. The lowest concentration of SARS-CoV-2 viral copies this assay can detect is 138 copies/mL. A negative result does not preclude SARS-Cov-2 infection and should not be used as the sole basis for treatment or other patient management decisions. A negative result may occur with  improper specimen collection/handling, submission of specimen other than nasopharyngeal swab, presence of viral mutation(s) within the areas targeted by this assay, and inadequate number of viral copies(<138 copies/mL). A negative result must be combined with clinical observations, patient history, and epidemiological information. The expected result is Negative.  Fact Sheet for Patients:  EntrepreneurPulse.com.au  Fact Sheet for Healthcare Providers:  IncredibleEmployment.be  This test is no t yet approved or cleared by the [place] FDA and  has been authorized for detection and/or diagnosis of SARS-CoV-2 by FDA under an Emergency Use Authorization (EUA). This EUA will remain  in effect (meaning this test can be used) for the duration of the COVID-19  declaration under Section 564(b)(1) of the Act, 21 U.S.C.section 360bbb-3(b)(1), unless the authorization is terminated  or revoked sooner.       Influenza A by PCR NEGATIVE NEGATIVE   Influenza B by PCR NEGATIVE NEGATIVE    Comment: (NOTE) The Xpert Xpress SARS-CoV-2/FLU/RSV plus assay is intended as an aid in the diagnosis of influenza from Nasopharyngeal swab specimens and should not be used as a sole basis for treatment. Nasal washings and aspirates are unacceptable for Xpert Xpress SARS-CoV-2/FLU/RSV testing.  Fact Sheet for Patients: EntrepreneurPulse.com.au  Fact Sheet for Healthcare Providers: IncredibleEmployment.be  This test is not yet approved or cleared by the [place] FDA and has been  authorized for detection and/or diagnosis of SARS-CoV-2 by FDA under an Emergency Use Authorization (EUA). This EUA will remain in effect (meaning this test can be used) for the duration of the COVID-19 declaration under Section 564(b)(1) of the Act, 21 U.S.C. section 360bbb-3(b)(1), unless the authorization is terminated or revoked.  Performed at [facility], [place] [address], [place], [place] [address]   Hepatic function panel     Status: Abnormal   Collection Time: 06/04/21  8:00 PM  Result Value Ref Range   Total Protein 6.6 6.5 - 8.1 g/dL   Albumin 3.8 3.5 - 5.0 g/dL   AST 67 (H) 15 - 41 U/L   ALT 105 (H) 0 - 44 U/L   Alkaline Phosphatase 61 38 - 126 U/L   Total Bilirubin 0.7 0.3 - 1.2 mg/dL   Bilirubin, Direct 0.1 0.0 - 0.2 mg/dL   Indirect Bilirubin 0.6 mg/dL    Comment: Performed at [place] [address], [place], [place] [address]  Lipase, blood     Status: None   Collection Time: 06/04/21  8:00 PM  Result Value Ref Range   Lipase 32 11 - 51 U/L    Comment: Performed at [facility], [place] [address] [place], [place] [address]  Troponin I (High Sensitivity)     Status: Abnormal   Collection Time:  06/04/21  8:00 PM  Result Value Ref Range   Troponin I (High Sensitivity) 6,007 (HH) <18 ng/L    Comment: CRITICAL VALUE NOTED.  VALUE IS CONSISTENT WITH PREVIOUSLY REPORTED AND CALLED VALUE. (NOTE) Elevated high sensitivity troponin I (hsTnI) values and significant  changes across serial measurements may suggest ACS but many other  chronic and acute conditions are known to elevate hsTnI results.  Refer to the Links section for chest pain algorithms and additional  guidance. Performed at [facility], [place] [address], [place], [place] [address]   HIV Antibody (routine testing w rflx)     Status: None   Collection Time: 06/04/21  9:35 PM  Result Value Ref Range   HIV Screen 4th Generation wRfx Non Reactive Non Reactive    Comment: Performed at [facility], [place] [address], [place], [place] [address]  Magnesium     Status: None   Collection Time: 06/04/21  9:35 PM  Result Value Ref Range   Magnesium 1.9 1.7 - 2.4 mg/dL    Comment: Performed at [place] [address] [place], [place] [address]  TSH     Status: None   Collection Time: 06/04/21 10:32 PM  Result Value Ref Range   TSH 3.408 0.350 - 4.500 uIU/mL    Comment: Performed by a 3rd Generation assay with a functional sensitivity of <=0.01 uIU/mL. Performed at [facility], [place] [address] [place],  [address]   Troponin I (High Sensitivity)     Status: Abnormal   Collection Time: 06/04/21 10:32 PM  Result Value Ref Range   Troponin I (High Sensitivity) 3,768 (HH) <18 ng/L    Comment: CRITICAL VALUE NOTED.  VALUE IS CONSISTENT WITH PREVIOUSLY REPORTED AND CALLED VALUE. (NOTE) Elevated high sensitivity troponin I (hsTnI) values and significant  changes across serial measurements may suggest ACS but many other  chronic and acute conditions are known to elevate hsTnI results.  Refer to the Links section for chest pain algorithms and additional  guidance. Performed at [facility],  [place],  [place] [address]   Troponin I (High Sensitivity)     Status: Abnormal   Collection Time: 06/05/21 12:00 AM  Result Value Ref Range   Troponin I (High Sensitivity) 3,045 (HH) <18 ng/L    Comment: CRITICAL VALUE NOTED.  VALUE IS CONSISTENT WITH PREVIOUSLY REPORTED AND CALLED VALUE. (NOTE) Elevated high sensitivity troponin I (hsTnI) values and significant  changes across serial measurements may suggest ACS but many other  chronic and acute conditions are known to elevate hsTnI results.  Refer to the Links section for chest pain algorithms and additional  guidance. Performed at [facility], [place] [address], [place], [place] [address]   Heparin level (unfractionated)     Status: Abnormal   Collection Time: 06/05/21  2:08 AM  Result Value Ref Range   Heparin Unfractionated 0.19 (L) 0.30 - 0.70 IU/mL    Comment: (NOTE) The clinical reportable range upper limit is being lowered to >1.10 to align with the FDA approved guidance for the current laboratory assay.  If heparin results are below expected values, and patient dosage has  been confirmed, suggest follow up testing of antithrombin III levels. Performed at [facility], [place] [address] [place], [place] [address]   Heparin level (unfractionated)     Status: None   Collection Time: 06/05/21  6:09 AM  Result Value Ref Range   Heparin Unfractionated 0.34 0.30 - 0.70 IU/mL    Comment: (NOTE) The clinical reportable range upper limit is being lowered to >1.10 to align with the FDA approved guidance for the current laboratory assay.  If heparin results are below expected values, and patient dosage has  been confirmed, suggest follow up testing of antithrombin III levels. Performed at [facility], [place] [address] [place], [place] [address]   CBC     Status: None   Collection Time: 06/05/21  6:10 AM  Result Value Ref Range   WBC 6.9 4.0 - 10.5 K/uL   RBC 4.27 4.22 - 5.81 MIL/uL    Hemoglobin 13.3 13.0 - 17.0 g/dL   HCT 39.0 39.0 - 52.0 %   MCV 91.3 80.0 - 100.0 fL   MCH 31.1 26.0 - 34.0 pg   MCHC 34.1 30.0 - 36.0 g/dL   RDW 12.3 11.5 - 15.5 %   Platelets 162 150 - 400 K/uL   nRBC 0.0 0.0 - 0.2 %    Comment: Performed at [facility], [place] [address] [place], [place] [id number]  Basic metabolic panel     Status: Abnormal   Collection Time: 06/05/21  6:10 AM  Result Value Ref Range   Sodium 137 135 - 145 mmol/L   Potassium 3.3 (L) 3.5 - 5.1 mmol/L   Chloride 100 98 - 111 mmol/L   CO2 27 22 - 32 mmol/L   Glucose, Bld 163 (H) 70 - 99 mg/dL    Comment: Glucose reference range applies only to samples taken after fasting for at least 8 hours.   BUN 9 6 - 20 mg/dL   Creatinine, Ser 0.97 0.61 - 1.24 mg/dL   Calcium 8.8 (L) 8.9 - 10.3 mg/dL   GFR, Estimated >60 >60 mL/min    Comment: (NOTE) Calculated using the CKD-EPI Creatinine Equation (2021)    Anion gap 10 5 - 15    Comment: Performed at [facility] [address], [place], [place] [address]    ECG   Sinus rhythm at 70, inferior Q waves - Personally Reviewed  Telemetry   Sinus rhythm - Personally Reviewed  Radiology    CARDIAC CATHETERIZATION  Result Date: 06/05/2021   Mid RCA to Dist RCA lesion is 45% stenosed.   Prox LAD to Mid LAD lesion is 30% stenosed with 40% stenosed side branch in 1st Diag.  Mid LAD lesion is 50% stenosed beyond 1st Diag. ->  Not flow-limiting, would not cause significant troponin elevation.   The left ventricular systolic function is normal.  The left ventricular ejection fraction is 55-65% by visual estimate.   LV end diastolic pressure is normal.   There is no aortic valve stenosis. SUMMARY No obvious culprit lesion to explain non-STEMI with troponins in the 7000 peak range =-> most significant lesion in his mid LAD 40 to 50% stenosis just after major diagonal branch. Otherwise diffuse mild CAD. Normal LVEF with normal wall motion, and normal LV EDP RECOMMENDATIONS We  will consider evaluation for different etiology for significant chest pain and elevated troponins-consider coronary spasm versus pericarditis/myocarditis. Have added amlodipine for possible spasm. Glenetta Hew, MD  DG Chest Portable 1 View  Result Date: 06/04/2021 CLINICAL DATA:  Chest pain EXAM: PORTABLE CHEST 1 VIEW COMPARISON:  06/04/2021 FINDINGS: The heart size and mediastinal contours are within normal limits. Both lungs are clear. The visualized skeletal structures are unremarkable. IMPRESSION: No active disease. Electronically Signed   By: Franchot Gallo M.D.   On: 06/04/2021 18:18   ECHOCARDIOGRAM COMPLETE  Result Date: 06/05/2021    ECHOCARDIOGRAM REPORT   Patient Name:   Joshua Flynn Date of Exam: 06/05/2021 Medical Rec #:  [id number]     Height:       70.0 in Accession #:    [id number]    Weight:       302.0 lb Date of Birth:  Apr 17, 1986     BSA:          2.487 m Patient Age:    43 years      BP:           111/70 mmHg Patient Gender: M             HR:           70 bpm. Exam Location:  Inpatient Procedure: 2D Echo and Intracardiac Opacification Agent Indications:    NSTEMI  History:        Patient has no prior history of Echocardiogram examinations.                 Acute MI; Risk Factors:Hypertension.  Sonographer:    Arlyss Gandy Referring Phys: 31 RHONDA G BARRETT  Sonographer Comments: Image acquisition challenging due to patient body habitus. IMPRESSIONS  1. Left ventricular ejection fraction, by estimation, is 55 to 60%. The left ventricle has normal function. The left ventricle has no regional wall motion abnormalities. Left ventricular diastolic parameters are consistent with Grade I diastolic dysfunction (impaired relaxation).  2. Right ventricular systolic function is normal. The right ventricular size is normal.  3. The mitral valve is normal in structure. No evidence of mitral valve regurgitation. No evidence of mitral stenosis.  4. The aortic valve is normal in structure. Aortic valve  regurgitation is trivial. No aortic stenosis is present.  5. The inferior vena cava is normal in size with greater than 50% respiratory variability, suggesting right atrial pressure of 3 mmHg. Comparison(s): No prior Echocardiogram. FINDINGS  Left Ventricle: Left ventricular ejection fraction, by estimation, is 55 to 60%. The left ventricle has normal function. The left ventricle  has no regional wall motion abnormalities. Definity contrast agent was given IV to delineate the left ventricular  endocardial borders. The left ventricular internal cavity size was normal in size. There is no left ventricular hypertrophy. Left ventricular diastolic parameters are consistent with Grade I diastolic dysfunction (impaired relaxation). Right Ventricle: The right ventricular size is normal. No increase in right ventricular wall thickness. Right ventricular systolic function is normal. Left Atrium: Left atrial size was normal in size. Right Atrium: Right atrial size was normal in size. Pericardium: There is no evidence of pericardial effusion. Mitral Valve: The mitral valve is normal in structure. No evidence of mitral valve regurgitation. No evidence of mitral valve stenosis. Tricuspid Valve: The tricuspid valve is normal in structure. Tricuspid valve regurgitation is not demonstrated. No evidence of tricuspid stenosis. Aortic Valve: The aortic valve is normal in structure. Aortic valve regurgitation is trivial. No aortic stenosis is present. Aortic valve mean gradient measures 3.0 mmHg. Aortic valve peak gradient measures 5.1 mmHg. Aortic valve area, by VTI measures 3.60 cm. Pulmonic Valve: The pulmonic valve was normal in structure. Pulmonic valve regurgitation is not visualized. No evidence of pulmonic stenosis. Aorta: The aortic root is normal in size and structure. Venous: The inferior vena cava is normal in size with greater than 50% respiratory variability, suggesting right atrial pressure of 3 mmHg. IAS/Shunts: No  atrial level shunt detected by color flow Doppler.  LEFT VENTRICLE PLAX 2D LVIDd:         4.10 cm   Diastology LVIDs:         2.70 cm   LV e' medial:    9.90 cm/s LV PW:         1.20 cm   LV E/e' medial:  9.5 LV IVS:        1.20 cm   LV e' lateral:   10.70 cm/s LVOT diam:     2.20 cm   LV E/e' lateral: 8.8 LV SV:         88 LV SV Index:   35 LVOT Area:     3.80 cm  RIGHT VENTRICLE RV Basal diam:  3.20 cm RV Mid diam:    2.80 cm RV S prime:     10.80 cm/s TAPSE (M-mode): 2.5 cm LEFT ATRIUM             Index        RIGHT ATRIUM           Index LA diam:        3.70 cm 1.49 cm/m   RA Area:     13.80 cm LA Vol (A2C):   32.9 ml 13.23 ml/m  RA Volume:   28.60 ml  11.50 ml/m LA Vol (A4C):   53.9 ml 21.67 ml/m LA Biplane Vol: 44.5 ml 17.89 ml/m  AORTIC VALVE AV Area (Vmax):    3.77 cm AV Area (Vmean):   3.56 cm AV Area (VTI):     3.60 cm AV Vmax:           113.00 cm/s AV Vmean:          78.300 cm/s AV VTI:            0.245 m AV Peak Grad:      5.1 mmHg AV Mean Grad:      3.0 mmHg LVOT Vmax:         112.00 cm/s LVOT Vmean:        73.400 cm/s LVOT VTI:  0.232 m LVOT/AV VTI ratio: 0.95  AORTA Ao Root diam: 3.10 cm Ao Asc diam:  3.00 cm MITRAL VALVE               TRICUSPID VALVE MV Area (PHT): 3.34 cm    TR Peak grad:   19.5 mmHg MV Decel Time: 227 msec    TR Vmax:        221.00 cm/s MV E velocity: 94.30 cm/s MV A velocity: 72.80 cm/s  SHUNTS MV E/A ratio:  1.30        Systemic VTI:  0.23 m                            Systemic Diam: 2.20 cm Kardie Tobb DO Electronically signed by Berniece Salines DO Signature Date/Time: 06/05/2021/5:19:00 PM    Final     Cardiac Studies   Echo pending  Assessment   Principal Problem:   Myocardial infarction with nonobstructive coronary arteries (HCC) Active Problems:   HTN (hypertension)   Dyslipidemia, goal LDL below 55   Neuropathy   Depression   CAD in native artery   OSA on CPAP   Morbid obesity (HCC)   Plan   Cath showed non-obstructive CAD - there is distal  small vessel disease, would consider this MINOCA. He was on good medical therapy already and compliant with medication. Added amlodipine 2.5 mg daily - metoprolol decreased to 50 mg BID. Check lipid, A1c, BMET, magnesium today- further titrate medicine as necessary. Anticipate d/c home later today. Would fill new RX's with [facility] pharmacy and give hard RX's to take to the [facility] in follow-up\ with cardiology there.  Time Spent Directly with Patient:  I have spent a total of 25 minutes with the patient reviewing hospital notes, telemetry, EKGs, labs and examining the patient as well as establishing an assessment and plan that was discussed personally with the patient.  > 50% of time was spent in direct patient care.  Length of Stay:  LOS: 2 days   Pixie Casino, MD, [facility], [place] Director of the Advanced Lipid Disorders &  Cardiovascular Risk Reduction Clinic Diplomate of the American Board of Clinical Lipidology Attending Cardiologist  Direct Dial: [phone]   Fax: [phone]  Website:  [email or web address] Osgood Derrel Moore 06/06/2021, 10:01 AM

## 2021-06-07 LAB — HEMOGLOBIN A1C
Hgb A1c MFr Bld: 6.2 % — ABNORMAL HIGH (ref 4.8–5.6)
Mean Plasma Glucose: 131 mg/dL

## 2021-06-11 ENCOUNTER — Telehealth (HOSPITAL_COMMUNITY): Payer: Self-pay

## 2021-06-11 NOTE — Telephone Encounter (Signed)
Per phase I cardiac rehab, fax cardiac rehab referral to  cardiac rehab. °

## 2021-06-13 ENCOUNTER — Telehealth: Payer: Self-pay | Admitting: Internal Medicine

## 2021-06-13 NOTE — Telephone Encounter (Signed)
06/13/2021  - [employer] [employer] [facility] received FMLA forms from [employer].   - Release, Billing and $29 forms fee completed.

## 2021-06-25 NOTE — Telephone Encounter (Signed)
06/25/2021  Spoke with patient spouse  - informed that patient no longer works for employer. Patient no longer needs forms. Forms payment was check - spouse requested I shred payment. I offered to discard payment and paperwork.

## 2021-06-26 ENCOUNTER — Other Ambulatory Visit (HOSPITAL_COMMUNITY): Payer: Self-pay | Admitting: Physician Assistant

## 2021-06-26 DIAGNOSIS — I514 Myocarditis, unspecified: Secondary | ICD-10-CM

## 2022-11-03 ENCOUNTER — Emergency Department (HOSPITAL_COMMUNITY)
Admission: EM | Admit: 2022-11-03 | Discharge: 2022-11-03 | Disposition: A | Discharge status: Home / Self Care | Payer: No Typology Code available for payment source | Attending: Emergency Medicine | Admitting: Emergency Medicine

## 2022-11-03 ENCOUNTER — Emergency Department (HOSPITAL_COMMUNITY): Payer: No Typology Code available for payment source

## 2022-11-03 ENCOUNTER — Encounter (HOSPITAL_COMMUNITY): Payer: Self-pay

## 2022-11-03 ENCOUNTER — Other Ambulatory Visit: Payer: Self-pay

## 2022-11-03 DIAGNOSIS — R079 Chest pain, unspecified: Secondary | ICD-10-CM | POA: Diagnosis present

## 2022-11-03 DIAGNOSIS — Z7982 Long term (current) use of aspirin: Secondary | ICD-10-CM | POA: Insufficient documentation

## 2022-11-03 DIAGNOSIS — R61 Generalized hyperhidrosis: Secondary | ICD-10-CM | POA: Insufficient documentation

## 2022-11-03 DIAGNOSIS — I2510 Atherosclerotic heart disease of native coronary artery without angina pectoris: Secondary | ICD-10-CM | POA: Diagnosis not present

## 2022-11-03 LAB — BASIC METABOLIC PANEL
Anion gap: 12 (ref 5–15)
BUN: 7 mg/dL (ref 6–20)
CO2: 21 mmol/L — ABNORMAL LOW (ref 22–32)
Calcium: 9.2 mg/dL (ref 8.9–10.3)
Chloride: 103 mmol/L (ref 98–111)
Creatinine, Ser: 0.8 mg/dL (ref 0.61–1.24)
GFR, Estimated: >60 mL/min (ref >60)
Glucose, Bld: 118 mg/dL — ABNORMAL HIGH (ref 70–99)
Potassium: 3.8 mmol/L (ref 3.5–5.1)
Sodium: 136 mmol/L (ref 135–145)

## 2022-11-03 LAB — CBC
HCT: 43.7 % (ref 39.0–52.0)
Hemoglobin: 15.2 g/dL (ref 13.0–17.0)
MCH: 31.3 pg (ref 26.0–34.0)
MCHC: 34.8 g/dL (ref 30.0–36.0)
MCV: 89.9 fL (ref 80.0–100.0)
Platelets: 213 10*3/uL (ref 150–400)
RBC: 4.86 MIL/uL (ref 4.22–5.81)
RDW: 12.1 % (ref 11.5–15.5)
WBC: 10.3 10*3/uL (ref 4.0–10.5)
nRBC: 0 % (ref 0.0–0.2)

## 2022-11-03 LAB — D-DIMER, QUANTITATIVE: D-Dimer, Quant: <0.27 ug/mL-FEU (ref 0.00–0.50)

## 2022-11-03 LAB — TROPONIN I (HIGH SENSITIVITY)
Troponin I (High Sensitivity): 3 ng/L (ref <18)
Troponin I (High Sensitivity): 4 ng/L (ref <18)

## 2022-11-03 MED ORDER — NITROGLYCERIN 2 % TD OINT
1.0000 [in_us] | TOPICAL_OINTMENT | Freq: Once | TRANSDERMAL | Status: AC
  Administered 2022-11-03: 1 [in_us] via TOPICAL
  Filled 2022-11-03: qty 1

## 2022-11-03 NOTE — ED Triage Notes (Signed)
Sudden onset of nonexertional CP x1 hour. Pt diaphoretic on exam. Pt h/x 2 heart attacks, last in February.   fentanyl 20g RH 4 nitroglycerin prior to EMS arrival 324 ASA with EMS  160/100 98% 2L o2

## 2022-11-03 NOTE — Discharge Instructions (Addendum)
Please see your cardiologist as soon as possible, and plan to follow up with your PCP at the [facility] this week.

## 2022-11-03 NOTE — ED Provider Notes (Signed)
[facility] Provider Note   CSN: [id number] Arrival date & time: 11/03/22  1922     History {Add pertinent medical, surgical, social history, OB history to HPI:1} Chief Complaint  Patient presents with   Chest Pain    Joshua Flynn is a 37 y.o. male.  37 year old male with a history of nonocclusive coronary artery disease, NSTEMI, status post coronary angiography without stent placement x 2 presents with chest pressure and pain unrelieved by nitroglycerin.  Reports she took nitroglycerin and aspirin multiple times earlier and did not relieve his chest pain which started while he was sitting down approximately 1.5 hours ago.  He reports that it is a central chest pressure that radiates up into his left neck and jaw.  Denies any other symptoms including fever, chills, vomiting.  The history is provided by the patient and the spouse.  Chest Pain Pain location:  Substernal area and L chest Pain quality: pressure and radiating   Pain radiates to:  Neck Pain severity:  Severe Onset quality:  Sudden Duration:  2 hours Timing:  Constant Progression:  Unable to specify Chronicity:  New Context: at rest   Relieved by:  Nitroglycerin Ineffective treatments:  Aspirin Associated symptoms: diaphoresis   Associated symptoms: no fever   Risk factors: high cholesterol, male sex and obesity        Home Medications Prior to Admission medications   Medication Sig Start Date End Date Taking? Authorizing Provider  amLODipine (NORVASC) 2.5 MG tablet Take 1 tablet (2.5 mg total) by mouth daily. 06/07/21   Dunn, Tacey Ruiz, PA-C  aspirin 81 MG EC tablet Take 81 mg by mouth daily. 09/06/20   [provider]  atorvastatin (LIPITOR) 80 MG tablet Take 80 mg by mouth at bedtime. 09/10/16   [provider]  buPROPion (WELLBUTRIN XL) 300 MG 24 hr tablet Take 300 mg by mouth every morning. 07/10/20   [provider]  busPIRone (BUSPAR) 15 MG  tablet Take 15 mg by mouth in the morning and at bedtime. 04/03/21   [provider]  carboxymethylcellulose (REFRESH PLUS) 0.5 % SOLN Place 1 drop into both eyes in the morning, at noon, in the evening, and at bedtime. 04/03/21   [provider]  clopidogrel (PLAVIX) 75 MG tablet Take 75 mg by mouth daily. 09/06/20   [provider]  DULoxetine (CYMBALTA) 60 MG capsule Take 60 mg by mouth 2 (two) times daily. 07/10/20   [provider]  ezetimibe (ZETIA) 10 MG tablet Take 1 tablet (10 mg total) by mouth daily. 06/06/21 06/06/22  Dunn, Tacey Ruiz, PA-C  gabapentin (NEURONTIN) 300 MG capsule Take 300 mg by mouth 2 (two) times daily.    [provider]  isosorbide mononitrate (IMDUR) 120 MG 24 hr tablet Take 120 mg by mouth daily. 09/06/20   [provider]  methylphenidate 36 MG PO CR tablet Take 36 mg by mouth every morning. 05/09/21   [provider]  metoprolol tartrate (LOPRESSOR) 50 MG tablet Take 1 tablet (50 mg total) by mouth in the morning and at bedtime. 06/06/21   Dunn, Tacey Ruiz, PA-C  nitroGLYCERIN (NITROSTAT) 0.4 MG SL tablet Place 1 tablet (0.4 mg total) under the tongue every 5 (five) minutes as needed for chest pain (up to 3 doses). 06/06/21   Dunn, Tacey Ruiz, PA-C  traZODone (DESYREL) 100 MG tablet Take 100 mg by mouth at bedtime. 07/10/20   [provider]  Allergies    Patient has no known allergies.    Review of Systems   Review of Systems  Constitutional:  Positive for diaphoresis. Negative for chills and fever.  Cardiovascular:  Positive for chest pain.    Physical Exam Updated Vital Signs BP (!) 125/50   Pulse 89   Temp 98.1 F (36.7 C) (Oral)   Resp 18   Ht 5\' 10"  (1.778 m)   Wt 133.8 kg   SpO2 95%   BMI 42.33 kg/m  Physical Exam Vitals and nursing note reviewed.  Constitutional:      General: He is in acute distress.     Appearance: He is obese. He is diaphoretic. He is not ill-appearing or  toxic-appearing.  HENT:     Head: Normocephalic.  Eyes:     Pupils: Pupils are equal, round, and reactive to light.  Cardiovascular:     Rate and Rhythm: Regular rhythm. Tachycardia present.     Heart sounds: Normal heart sounds.  Pulmonary:     Effort: Pulmonary effort is normal. No tachypnea.     Breath sounds: Normal breath sounds. No decreased breath sounds.  Chest:     Chest wall: No mass or tenderness.  Musculoskeletal:     Right lower leg: No edema.     Left lower leg: No edema.  Skin:    General: Skin is warm.     Capillary Refill: Capillary refill takes less than 2 seconds.  Neurological:     General: No focal deficit present.     Mental Status: He is alert and oriented to person, place, and time.  Psychiatric:        Mood and Affect: Mood is anxious.     ED Results / Procedures / Treatments   Labs (all labs ordered are listed, but only abnormal results are displayed) Labs Reviewed  BASIC METABOLIC PANEL - Abnormal; Notable for the following components:      Result Value   CO2 21 (*)    Glucose, Bld 118 (*)    All other components within normal limits  CBC  D-DIMER, QUANTITATIVE  TROPONIN I (HIGH SENSITIVITY)  TROPONIN I (HIGH SENSITIVITY)    EKG EKG Interpretation Date/Time:  Monday November 03 2022 19:29:54 EDT Ventricular Rate:  105 PR Interval:  168 QRS Duration:  86 QT Interval:  344 QTC Calculation: 454 R Axis:   27  Text Interpretation: Sinus tachycardia Inferior infarct , age undetermined Cannot rule out Anterior infarct , age undetermined Abnormal ECG When compared with ECG of 06-Jun-2021 05:23, PREVIOUS ECG IS PRESENT Confirmed by Blane Ohara [phone]) on 11/03/2022 7:46:45 PM  Radiology DG Chest Port 1 View  Result Date: 11/03/2022 CLINICAL DATA:  Chest pain EXAM: PORTABLE CHEST 1 VIEW COMPARISON:  06/04/2021 FINDINGS: Low lung volumes. No focal opacity or pleural effusion. Normal cardiac size. No pneumothorax IMPRESSION: Low lung volumes.  Electronically Signed   By: Jasmine Pang M.D.   On: 11/03/2022 20:48    Procedures Procedures  {Document cardiac monitor, telemetry assessment procedure when appropriate:1}  Medications Ordered in ED Medications  nitroGLYCERIN (NITROGLYN) 2 % ointment 1 inch (1 inch Topical Given 11/03/22 2004)    ED Course/ Medical Decision Making/ A&P   {   Click here for ABCD2, HEART and other calculatorsREFRESH Note before signing :1}                          Medical Decision Making Chest pain concerning for  ACS.  Exam is without evidence of volume overload, so doubt heart failure.  EKG is without any signs of active ischemia.  Given the timing of ER presentation, first troponin was ordered and was negative so doubt NSTEMI.  Presentation is not consistent with acute PE, he is Wells low risk though cannot PERC him out due to his heart rate being above 100.  Will obtain D-dimer, delta troponin.  Presentation is not consistent with thoracic aortic dissection, pericarditis, tamponade, pneumonia, myocarditis.  Heart score 2.  Will plan to discharge patient home with close cardiology follow-up as he is already established with a cardiologist, pending delta Trope and D-dimer.    Amount and/or Complexity of Data Reviewed Labs: ordered.  Risk Prescription drug management.   ***  {Document critical care time when appropriate:1} {Document review of labs and clinical decision tools ie heart score, Chads2Vasc2 etc:1}  {Document your independent review of radiology images, and any outside records:1} {Document your discussion with family members, caretakers, and with consultants:1} {Document social determinants of health affecting pt's care:1} {Document your decision making why or why not admission, treatments were needed:1} Final Clinical Impression(s) / ED Diagnoses Final diagnoses:  Chest pain, unspecified type    Rx / DC Orders ED Discharge Orders     None

## 2023-02-21 IMAGING — DX DG CHEST 1V PORT
1 series · 2 of 2 positions shown · non-contrast
Comparison: 06/04/2021

CLINICAL DATA: Chest pain

EXAM:
PORTABLE CHEST 1 VIEW

[Series 1: chest · 0.14mm/px · 2 of 2 slices shown]
[im 1/2]
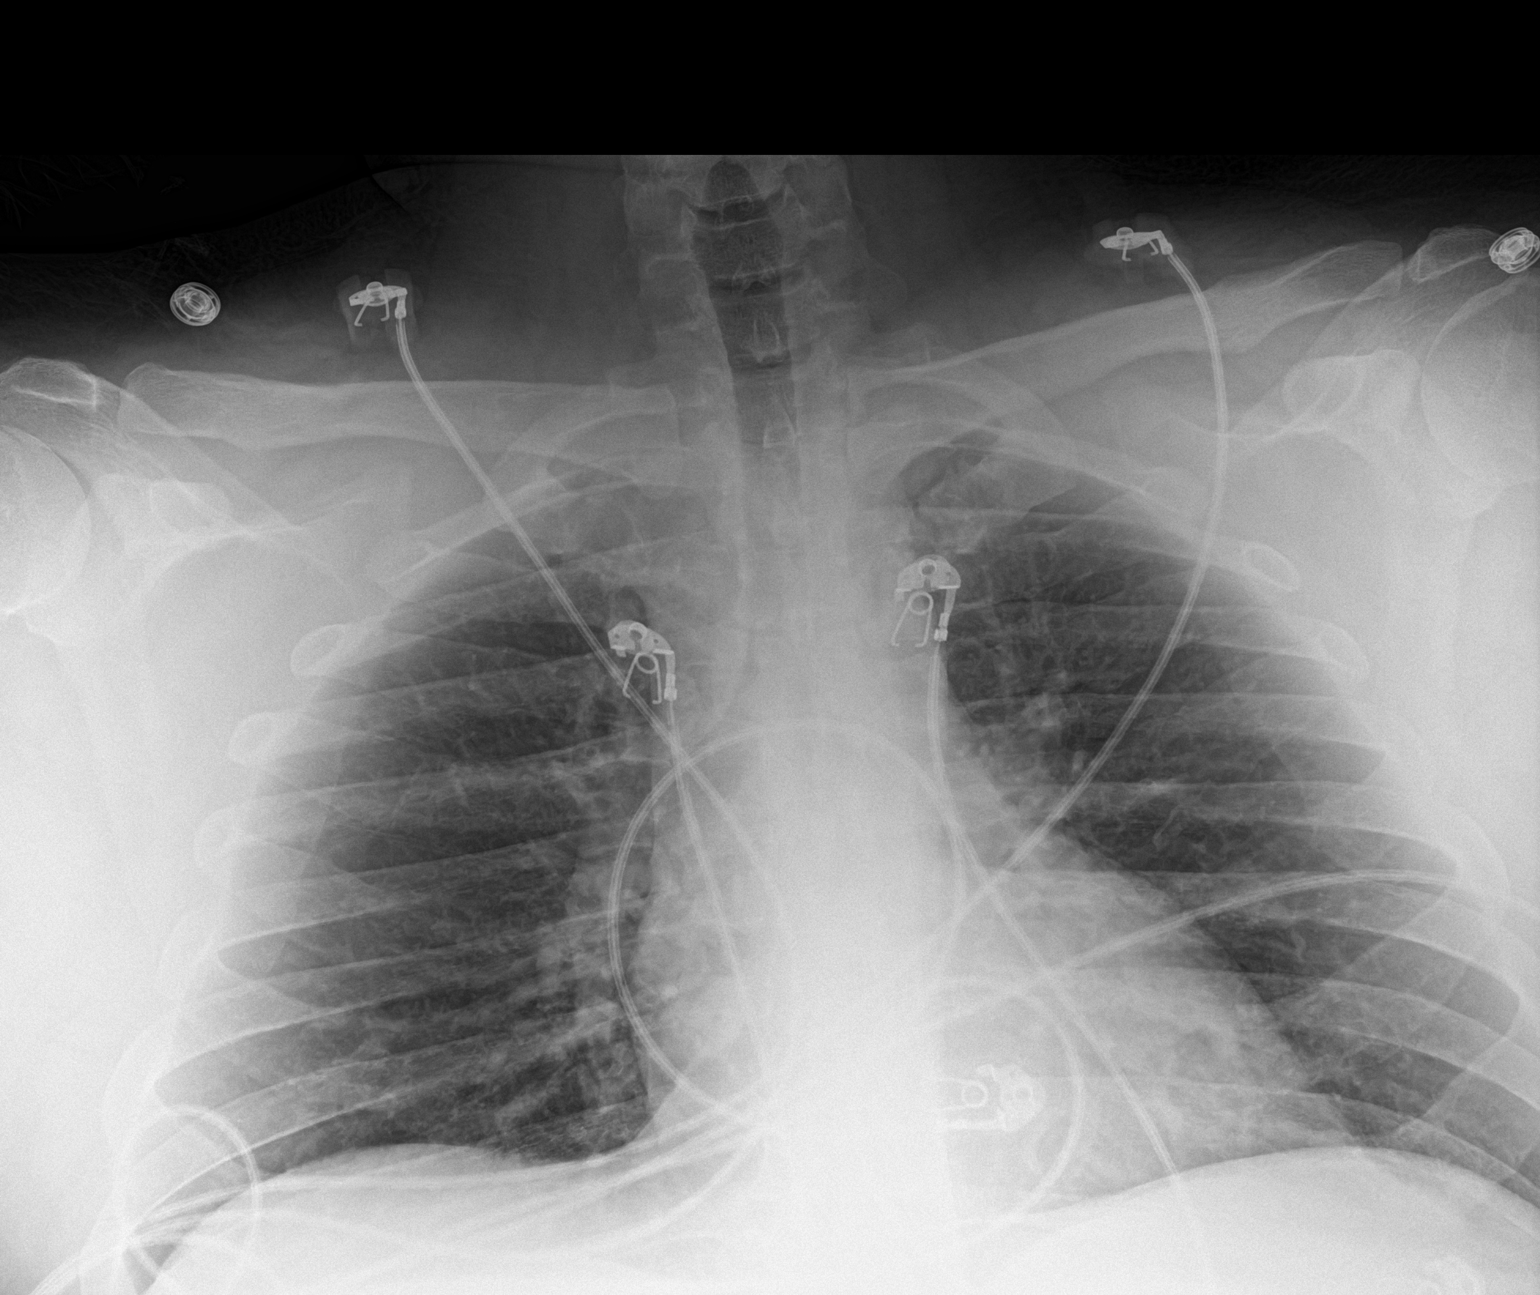
[im 2/2]
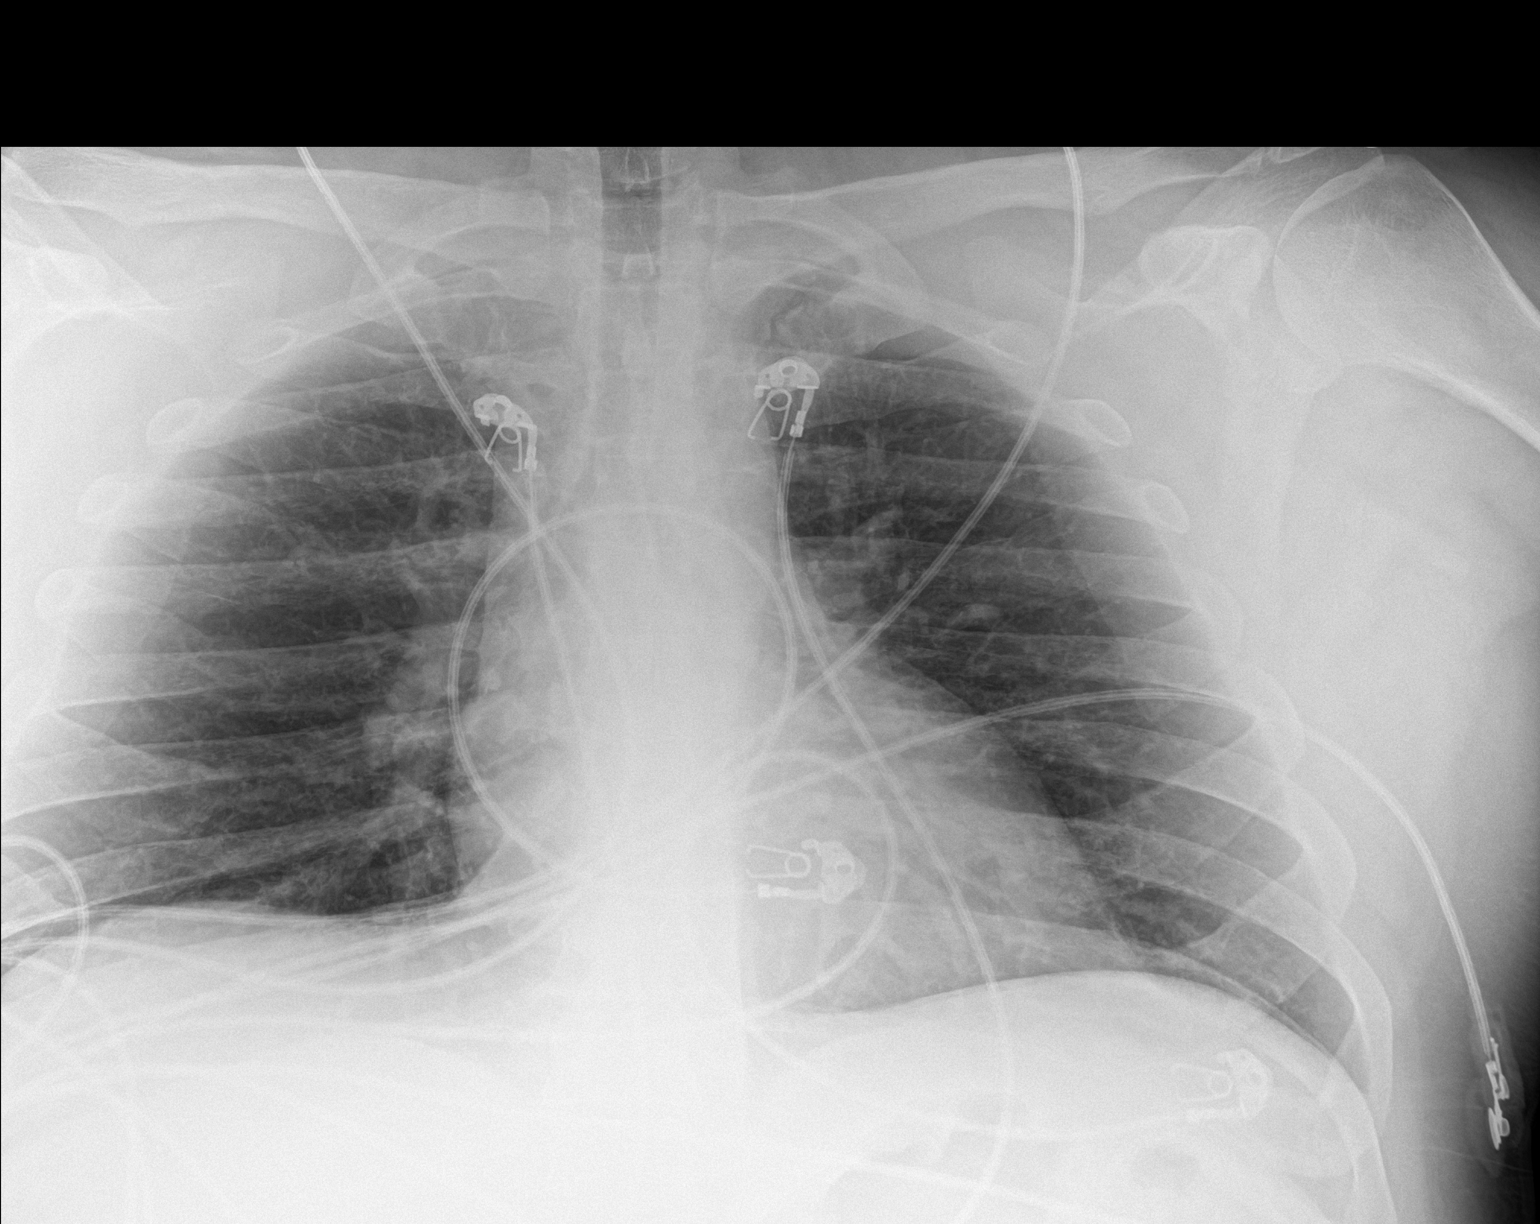

[2 of 2 positions shown; findings below may reference images not displayed]

FINDINGS: The heart size and mediastinal contours are within normal limits.
Both lungs are clear. The visualized skeletal structures are
unremarkable.
IMPRESSION: No active disease.
